# Patient Record
Sex: Male | Born: 1955 | Race: Black or African American | Hispanic: No | Smoking: Light tobacco smoker
Health system: Southern US, Community
[De-identification: ages and names within clinical notes are randomized; demographics above are authoritative.]

## PROBLEM LIST (undated history)

## (undated) ENCOUNTER — Emergency Department (HOSPITAL_COMMUNITY): Admission: EM | Discharge status: Against Medical Advice | Payer: No Typology Code available for payment source

## (undated) DIAGNOSIS — I10 Essential (primary) hypertension: Secondary | ICD-10-CM

## (undated) DIAGNOSIS — E119 Type 2 diabetes mellitus without complications: Secondary | ICD-10-CM

## (undated) DIAGNOSIS — E785 Hyperlipidemia, unspecified: Secondary | ICD-10-CM

## (undated) DIAGNOSIS — S060X9A Concussion with loss of consciousness of unspecified duration, initial encounter: Secondary | ICD-10-CM

## (undated) DIAGNOSIS — S060XAA Concussion with loss of consciousness status unknown, initial encounter: Secondary | ICD-10-CM

## (undated) DIAGNOSIS — K219 Gastro-esophageal reflux disease without esophagitis: Secondary | ICD-10-CM

## (undated) DIAGNOSIS — M1990 Unspecified osteoarthritis, unspecified site: Secondary | ICD-10-CM

## (undated) DIAGNOSIS — S065X9A Traumatic subdural hemorrhage with loss of consciousness of unspecified duration, initial encounter: Secondary | ICD-10-CM

## (undated) DIAGNOSIS — G4730 Sleep apnea, unspecified: Secondary | ICD-10-CM

## (undated) HISTORY — PX: BRAIN SURGERY: SHX531

## (undated) HISTORY — PX: JOINT REPLACEMENT: SHX530

## (undated) HISTORY — DX: Hyperlipidemia, unspecified: E78.5

---

## 2004-08-15 ENCOUNTER — Ambulatory Visit: Payer: Self-pay | Admitting: Orthopedic Surgery

## 2004-08-22 ENCOUNTER — Ambulatory Visit: Payer: Self-pay | Admitting: Orthopedic Surgery

## 2004-10-15 ENCOUNTER — Emergency Department: Payer: Self-pay | Admitting: Emergency Medicine

## 2005-01-31 ENCOUNTER — Ambulatory Visit: Payer: Self-pay

## 2005-07-05 ENCOUNTER — Ambulatory Visit: Payer: Self-pay | Admitting: Family Medicine

## 2005-07-10 ENCOUNTER — Ambulatory Visit: Payer: Self-pay | Admitting: Family Medicine

## 2005-07-19 ENCOUNTER — Other Ambulatory Visit: Payer: Self-pay

## 2005-07-19 ENCOUNTER — Emergency Department: Payer: Self-pay | Admitting: Emergency Medicine

## 2005-07-20 ENCOUNTER — Ambulatory Visit: Payer: Self-pay | Admitting: Emergency Medicine

## 2006-05-01 ENCOUNTER — Ambulatory Visit: Payer: Self-pay | Admitting: General Surgery

## 2006-10-04 ENCOUNTER — Other Ambulatory Visit: Payer: Self-pay

## 2006-10-04 ENCOUNTER — Emergency Department: Payer: Self-pay | Admitting: Emergency Medicine

## 2007-11-10 ENCOUNTER — Emergency Department: Payer: Self-pay | Admitting: Internal Medicine

## 2008-09-23 ENCOUNTER — Emergency Department: Payer: Self-pay | Admitting: Emergency Medicine

## 2009-01-31 ENCOUNTER — Emergency Department: Payer: Self-pay | Admitting: Unknown Physician Specialty

## 2009-02-12 ENCOUNTER — Ambulatory Visit: Payer: Self-pay | Admitting: Internal Medicine

## 2010-01-10 ENCOUNTER — Encounter: Payer: Self-pay | Admitting: Internal Medicine

## 2010-02-03 ENCOUNTER — Encounter: Payer: Self-pay | Admitting: Internal Medicine

## 2010-08-17 ENCOUNTER — Emergency Department: Payer: Self-pay | Admitting: Emergency Medicine

## 2010-08-24 ENCOUNTER — Emergency Department: Payer: Self-pay | Admitting: Emergency Medicine

## 2011-02-15 ENCOUNTER — Emergency Department: Payer: Self-pay | Admitting: Emergency Medicine

## 2011-06-21 ENCOUNTER — Emergency Department: Payer: Self-pay | Admitting: Emergency Medicine

## 2012-06-05 DIAGNOSIS — S065X9A Traumatic subdural hemorrhage with loss of consciousness of unspecified duration, initial encounter: Secondary | ICD-10-CM

## 2012-06-05 DIAGNOSIS — S065XAA Traumatic subdural hemorrhage with loss of consciousness status unknown, initial encounter: Secondary | ICD-10-CM

## 2012-06-05 HISTORY — DX: Traumatic subdural hemorrhage with loss of consciousness status unknown, initial encounter: S06.5XAA

## 2012-06-05 HISTORY — DX: Traumatic subdural hemorrhage with loss of consciousness of unspecified duration, initial encounter: S06.5X9A

## 2012-08-11 ENCOUNTER — Emergency Department: Payer: Self-pay | Admitting: Emergency Medicine

## 2012-09-12 ENCOUNTER — Emergency Department: Payer: Self-pay | Admitting: Emergency Medicine

## 2012-09-12 LAB — CBC
HCT: 39 % — ABNORMAL LOW (ref 40.0–52.0)
HGB: 13.7 g/dL (ref 13.0–18.0)
MCH: 31.8 pg (ref 26.0–34.0)
MCV: 90 fL (ref 80–100)
Platelet: 176 10*3/uL (ref 150–440)
RBC: 4.32 10*6/uL — ABNORMAL LOW (ref 4.40–5.90)
RDW: 13.2 % (ref 11.5–14.5)

## 2012-09-12 LAB — COMPREHENSIVE METABOLIC PANEL
Albumin: 3.8 g/dL (ref 3.4–5.0)
Alkaline Phosphatase: 101 U/L (ref 50–136)
Chloride: 108 mmol/L — ABNORMAL HIGH (ref 98–107)
Glucose: 86 mg/dL (ref 65–99)
Potassium: 4 mmol/L (ref 3.5–5.1)
SGOT(AST): 25 U/L (ref 15–37)
SGPT (ALT): 41 U/L (ref 12–78)
Total Protein: 7.7 g/dL (ref 6.4–8.2)

## 2012-09-12 LAB — URINALYSIS, COMPLETE
Blood: NEGATIVE
Glucose,UR: NEGATIVE mg/dL (ref 0–75)
Hyaline Cast: 1
Ketone: NEGATIVE
Protein: NEGATIVE
Specific Gravity: 1.027 (ref 1.003–1.030)
Squamous Epithelial: 2

## 2012-09-20 DIAGNOSIS — I10 Essential (primary) hypertension: Secondary | ICD-10-CM | POA: Insufficient documentation

## 2012-12-28 ENCOUNTER — Emergency Department (HOSPITAL_COMMUNITY)
Admission: EM | Admit: 2012-12-28 | Discharge: 2012-12-28 | Discharge status: Against Medical Advice | Payer: Medicaid Other | Attending: Emergency Medicine | Admitting: Emergency Medicine

## 2012-12-28 ENCOUNTER — Encounter (HOSPITAL_COMMUNITY): Payer: Self-pay | Admitting: Emergency Medicine

## 2012-12-28 DIAGNOSIS — Z791 Long term (current) use of non-steroidal anti-inflammatories (NSAID): Secondary | ICD-10-CM | POA: Insufficient documentation

## 2012-12-28 DIAGNOSIS — Z79899 Other long term (current) drug therapy: Secondary | ICD-10-CM | POA: Insufficient documentation

## 2012-12-28 DIAGNOSIS — I10 Essential (primary) hypertension: Secondary | ICD-10-CM | POA: Insufficient documentation

## 2012-12-28 DIAGNOSIS — R059 Cough, unspecified: Secondary | ICD-10-CM | POA: Insufficient documentation

## 2012-12-28 DIAGNOSIS — R05 Cough: Secondary | ICD-10-CM

## 2012-12-28 DIAGNOSIS — M129 Arthropathy, unspecified: Secondary | ICD-10-CM | POA: Insufficient documentation

## 2012-12-28 DIAGNOSIS — R6883 Chills (without fever): Secondary | ICD-10-CM | POA: Insufficient documentation

## 2012-12-28 HISTORY — DX: Unspecified osteoarthritis, unspecified site: M19.90

## 2012-12-28 HISTORY — DX: Essential (primary) hypertension: I10

## 2012-12-28 NOTE — ED Notes (Signed)
Patient states that he is leaving because it is taking too long

## 2012-12-28 NOTE — ED Notes (Signed)
Several days of cough and feeling weak; had diarrhea all week long, vomited once last night. Having sweats; unsure if fever. Throat is also sore for entire week, but yesterday throat felt better, but today the throat pain is back and is worse than it was all week. Cough is productive of yellow, phlegm. Lung fields clear throughout all fields. Bowel sounds present in all quadrants. Besides the throat pain, reports no other pain. Had body aches earlier in the week, but does not have any currently.

## 2012-12-28 NOTE — ED Notes (Signed)
PT. REPORTS PRODUCTIVE COUGH WITH BODY ACHES AND CHILLS FOR SEVERAL DAYS UNRELIEVED BY OTC COUGH MEDICATIONS .

## 2012-12-28 NOTE — ED Provider Notes (Signed)
History  This chart was scribed for non-physician practitioner, Rob Cecilio Asper, working with Gavin Pound. Oletta Lamas, MD by Ardeen Jourdain, ED Scribe. This patient was seen in room TR07C/TR07C and the patient's care was started at 2027.  CSN: [id number]     Arrival date & time 12/28/12  1956  First MD Initiated Contact with Patient 12/28/12 2027     Chief Complaint  Patient presents with  . Cough    The history is provided by the patient. No language interpreter was used.    HPI Comments: Gabriel Russell is a 57 y.o. male who presents to the Emergency Department complaining of gradual onset, gradually worsening, constant productive cough with associated body aches, nausea, emesis, diarrhea, sweats and chills. He states the symptoms began 7 days ago. He states his last episode of emesis was last night. He states he had diarrhea for the past week as well. He reports some blood tinged sputum yesterday. He describes the sputum as yellow-green in color. He reports using OTC cold medications with no relief. He denies any constipation as an associated symptom.   Past Medical History  Diagnosis Date  . Hypertension   . Arthritis    Past Surgical History  Procedure Laterality Date  . Brain surgery     No family history on file. History  Substance Use Topics  . Smoking status: Never Smoker   . Smokeless tobacco: Not on file  . Alcohol Use: No    Review of Systems  Constitutional: Positive for chills.  Respiratory: Positive for cough.   All other systems reviewed and are negative.    Allergies  Amoxicillin  Home Medications   Current Outpatient Rx  Name  Route  Sig  Dispense  Refill  . hydrochlorothiazide (HYDRODIURIL) 25 MG tablet   Oral   Take 25 mg by mouth daily.         Marland Kitchen lisinopril (PRINIVIL,ZESTRIL) 5 MG tablet   Oral   Take 5 mg by mouth daily.         . naproxen (NAPROSYN) 500 MG tablet   Oral   Take 500 mg by mouth 2 (two) times daily with a meal.         .  oxyCODONE-acetaminophen (PERCOCET) 10-325 MG per tablet   Oral   Take 1 tablet by mouth 2 (two) times daily as needed for pain (arthritis).         . sildenafil (VIAGRA) 50 MG tablet   Oral   Take 50 mg by mouth daily as needed for erectile dysfunction.          Triage Vitals: BP 125/75  Pulse 88  Temp(Src) 98.3 F (36.8 C) (Oral)  Resp 18  SpO2 98%  Physical Exam  Nursing note and vitals reviewed. Constitutional: He is oriented to person, place, and time. He appears well-developed and well-nourished. No distress.  HENT:  Head: Normocephalic and atraumatic.  Mouth/Throat: Oropharynx is clear and moist. No oropharyngeal exudate.  TMs normal bilaterally   Eyes: EOM are normal.  Neck: Neck supple. No tracheal deviation present.  Cardiovascular: Normal rate, regular rhythm and normal heart sounds.  Exam reveals no gallop and no friction rub.   No murmur heard. Pulmonary/Chest: Effort normal and breath sounds normal. No respiratory distress. He has no wheezes. He has no rales. He exhibits no tenderness.  Abdominal: Soft.  Musculoskeletal: Normal range of motion.  Neurological: He is alert and oriented to person, place, and time.  Skin: Skin is warm and dry.  He is not diaphoretic.  Psychiatric: He has a normal mood and affect. His behavior is normal.    ED Course   Procedures (including critical care time)  DIAGNOSTIC STUDIES: Oxygen Saturation is 98% on room air, normal by my interpretation.    COORDINATION OF CARE:  9:16 PM-Discussed treatment plan which includes CXR with pt at bedside and pt agreed to plan.   9:48 PM- Pt left AMA. He states he did not want to wait for x-ray or other treatment. [name] informed the pt of the risks of leaving, pt understood and left anyways.   Labs Reviewed - No data to display No results found. 1. Cough     MDM  Patient with cough. States that he wants to leave AMA, because the nurses did not check on him for an hour. Patient  understood that he was awaiting his x-ray, but states that, I could have died, no one checked on me." This was after mine and the nurses initial evaluation, wherein the patient was found to be completely stable.  I told him that he'd be going to x-ray shortly. I saw the patient on his way out, and asked him why he is leaving. He stated that it was because the nurses didn't check on him. I discussed the risks of leaving, the patient said, "I'm not mad, I'm just tired of waiting, and I want to go home." Patient is discharged AMA.   I personally performed the services described in this documentation, which was scribed in my presence. The recorded information has been reviewed and is accurate.    Roxy Horseman, PA-C 12/29/12 0008

## 2012-12-30 NOTE — ED Provider Notes (Signed)
Medical screening examination/treatment/procedure(s) were performed by non-physician practitioner and as supervising physician I was immediately available for consultation/collaboration.   Mirca Yale Y. Cashton Hosley, MD 12/30/12 2312 

## 2013-01-12 ENCOUNTER — Observation Stay: Payer: Self-pay | Admitting: Internal Medicine

## 2013-01-12 LAB — CBC
HCT: 41.7 % (ref 40.0–52.0)
MCH: 31.2 pg (ref 26.0–34.0)
MCHC: 35.6 g/dL (ref 32.0–36.0)
RBC: 4.75 10*6/uL (ref 4.40–5.90)
RDW: 13.9 % (ref 11.5–14.5)

## 2013-01-12 LAB — URINALYSIS, COMPLETE
Bilirubin,UR: NEGATIVE
Glucose,UR: NEGATIVE mg/dL (ref 0–75)
Hyaline Cast: 5
Nitrite: NEGATIVE
Ph: 6 (ref 4.5–8.0)
Protein: NEGATIVE
RBC,UR: 4 /HPF (ref 0–5)
Specific Gravity: 1.019 (ref 1.003–1.030)
Squamous Epithelial: 3
WBC UR: 30 /HPF (ref 0–5)

## 2013-01-12 LAB — TROPONIN I: Troponin-I: <0.02 ng/mL

## 2013-01-12 LAB — COMPREHENSIVE METABOLIC PANEL
Calcium, Total: 9.3 mg/dL (ref 8.5–10.1)
Chloride: 105 mmol/L (ref 98–107)
Co2: 27 mmol/L (ref 21–32)
EGFR (African American): >60
EGFR (Non-African Amer.): >60
Glucose: 94 mg/dL (ref 65–99)
Osmolality: 274 (ref 275–301)
SGOT(AST): 66 U/L — ABNORMAL HIGH (ref 15–37)
SGPT (ALT): 37 U/L (ref 12–78)
Sodium: 137 mmol/L (ref 136–145)

## 2013-01-12 LAB — DRUG SCREEN, URINE
Barbiturates, Ur Screen: NEGATIVE (ref ?–200)
Cannabinoid 50 Ng, Ur ~~LOC~~: POSITIVE (ref ?–50)
Cocaine Metabolite,Ur ~~LOC~~: NEGATIVE (ref ?–300)
MDMA (Ecstasy)Ur Screen: NEGATIVE (ref ?–500)
Opiate, Ur Screen: NEGATIVE (ref ?–300)
Phencyclidine (PCP) Ur S: NEGATIVE (ref ?–25)
Tricyclic, Ur Screen: NEGATIVE (ref ?–1000)

## 2013-01-12 LAB — PROTIME-INR: Prothrombin Time: 13.1 secs (ref 11.5–14.7)

## 2013-01-12 LAB — ETHANOL: Ethanol: <3 mg/dL

## 2013-01-12 LAB — CK: CK, Total: 2280 U/L — ABNORMAL HIGH (ref 35–232)

## 2013-01-13 LAB — BASIC METABOLIC PANEL
Anion Gap: 5 — ABNORMAL LOW (ref 7–16)
BUN: 14 mg/dL (ref 7–18)
Chloride: 105 mmol/L (ref 98–107)
Co2: 27 mmol/L (ref 21–32)
Creatinine: 1 mg/dL (ref 0.60–1.30)
Glucose: 110 mg/dL — ABNORMAL HIGH (ref 65–99)
Sodium: 137 mmol/L (ref 136–145)

## 2013-01-13 LAB — CBC WITH DIFFERENTIAL/PLATELET
Eosinophil #: 0 10*3/uL (ref 0.0–0.7)
Eosinophil %: 0.4 %
Lymphocyte %: 11 %
MCHC: 35.8 g/dL (ref 32.0–36.0)
MCV: 88 fL (ref 80–100)
Monocyte #: 0.5 x10 3/mm (ref 0.2–1.0)
Monocyte %: 7.9 %
Neutrophil #: 5.4 10*3/uL (ref 1.4–6.5)
Platelet: 184 10*3/uL (ref 150–440)
WBC: 6.8 10*3/uL (ref 3.8–10.6)

## 2013-01-15 LAB — URINE CULTURE

## 2013-01-17 LAB — CULTURE, BLOOD (SINGLE)

## 2013-04-09 ENCOUNTER — Ambulatory Visit: Payer: Self-pay | Admitting: Specialist

## 2013-04-09 LAB — BASIC METABOLIC PANEL
Anion Gap: 4 — ABNORMAL LOW (ref 7–16)
Calcium, Total: 9 mg/dL (ref 8.5–10.1)
EGFR (African American): >60
EGFR (Non-African Amer.): >60
Osmolality: 277 (ref 275–301)
Sodium: 137 mmol/L (ref 136–145)

## 2013-04-09 LAB — URINALYSIS, COMPLETE
Bilirubin,UR: NEGATIVE
Blood: NEGATIVE
Glucose,UR: NEGATIVE mg/dL (ref 0–75)
Hyaline Cast: 5
Leukocyte Esterase: NEGATIVE
Nitrite: NEGATIVE
Ph: 5 (ref 4.5–8.0)
RBC,UR: 1 /HPF (ref 0–5)
Specific Gravity: 1.026 (ref 1.003–1.030)
WBC UR: 6 /HPF (ref 0–5)

## 2013-04-09 LAB — PROTIME-INR
INR: 1
Prothrombin Time: 13.3 secs (ref 11.5–14.7)

## 2013-04-09 LAB — CBC
HCT: 40.9 % (ref 40.0–52.0)
MCH: 31 pg (ref 26.0–34.0)
MCV: 87 fL (ref 80–100)
Platelet: 181 10*3/uL (ref 150–440)
RBC: 4.68 10*6/uL (ref 4.40–5.90)
RDW: 14.4 % (ref 11.5–14.5)

## 2013-04-09 LAB — APTT: Activated PTT: 32.9 secs (ref 23.6–35.9)

## 2013-04-09 LAB — MRSA PCR SCREENING

## 2013-04-18 ENCOUNTER — Inpatient Hospital Stay: Payer: Self-pay | Admitting: Specialist

## 2013-04-18 LAB — CREATININE, SERUM: Creatinine: 1.26 mg/dL (ref 0.60–1.30)

## 2013-04-19 LAB — HEMOGLOBIN: HGB: 11.6 g/dL — ABNORMAL LOW (ref 13.0–18.0)

## 2013-04-22 LAB — CREATININE, SERUM: EGFR (African American): >60

## 2013-06-21 ENCOUNTER — Emergency Department: Payer: Self-pay | Admitting: Emergency Medicine

## 2013-07-25 ENCOUNTER — Ambulatory Visit: Payer: Self-pay | Admitting: Internal Medicine

## 2013-08-02 ENCOUNTER — Ambulatory Visit: Payer: Self-pay | Admitting: Neurosurgery

## 2013-08-06 ENCOUNTER — Other Ambulatory Visit: Payer: Self-pay | Admitting: Neurosurgery

## 2013-08-26 ENCOUNTER — Encounter (HOSPITAL_COMMUNITY)
Admission: RE | Admit: 2013-08-26 | Discharge: 2013-08-26 | Disposition: A | Discharge status: Home / Self Care | Payer: Medicaid Other | Source: Ambulatory Visit | Attending: Neurosurgery | Admitting: Neurosurgery

## 2013-08-26 ENCOUNTER — Encounter (HOSPITAL_COMMUNITY): Payer: Self-pay

## 2013-08-26 DIAGNOSIS — Z01818 Encounter for other preprocedural examination: Secondary | ICD-10-CM | POA: Insufficient documentation

## 2013-08-26 DIAGNOSIS — Z01810 Encounter for preprocedural cardiovascular examination: Secondary | ICD-10-CM | POA: Insufficient documentation

## 2013-08-26 DIAGNOSIS — Z01812 Encounter for preprocedural laboratory examination: Secondary | ICD-10-CM | POA: Insufficient documentation

## 2013-08-26 HISTORY — DX: Concussion with loss of consciousness status unknown, initial encounter: S06.0XAA

## 2013-08-26 HISTORY — DX: Concussion with loss of consciousness of unspecified duration, initial encounter: S06.0X9A

## 2013-08-26 HISTORY — DX: Sleep apnea, unspecified: G47.30

## 2013-08-26 LAB — BASIC METABOLIC PANEL
BUN: 13 mg/dL (ref 6–23)
CO2: 25 mEq/L (ref 19–32)
Calcium: 9.7 mg/dL (ref 8.4–10.5)
Chloride: 98 mEq/L (ref 96–112)
Creatinine, Ser: 1.09 mg/dL (ref 0.50–1.35)
GFR calc non Af Amer: 73 mL/min — ABNORMAL LOW (ref >90)
GFR, EST AFRICAN AMERICAN: 85 mL/min — AB (ref >90)
Glucose, Bld: 94 mg/dL (ref 70–99)
POTASSIUM: 4 meq/L (ref 3.7–5.3)
SODIUM: 138 meq/L (ref 137–147)

## 2013-08-26 LAB — CBC
HCT: 44.5 % (ref 39.0–52.0)
HEMOGLOBIN: 16.1 g/dL (ref 13.0–17.0)
MCH: 31.5 pg (ref 26.0–34.0)
MCHC: 36.2 g/dL — AB (ref 30.0–36.0)
MCV: 87.1 fL (ref 78.0–100.0)
Platelets: 179 10*3/uL (ref 150–400)
RBC: 5.11 MIL/uL (ref 4.22–5.81)
RDW: 14.2 % (ref 11.5–15.5)
WBC: 5.1 10*3/uL (ref 4.0–10.5)

## 2013-08-26 LAB — SURGICAL PCR SCREEN
MRSA, PCR: NEGATIVE
STAPHYLOCOCCUS AUREUS: NEGATIVE

## 2013-08-26 NOTE — Pre-Procedure Instructions (Signed)
Gabriel Russell  08/26/2013   Your procedure is scheduled on:  09/04/13  Report to [facility]  2 * 3 at 930 AM.  Call this number if you have problems the morning of surgery: [phone]   Remember:   Do not eat food or drink liquids after midnight.   Take these medicines the morning of surgery with A SIP OF WATER: percocet   Do not wear jewelry, make-up or nail polish.  Do not wear lotions, powders, or perfumes. You may wear deodorant.  Do not shave 48 hours prior to surgery. Men may shave face and neck.  Do not bring valuables to the hospital.  [facility] is not responsible                  for any belongings or valuables.               Contacts, dentures or bridgework may not be worn into surgery.  Leave suitcase in the car. After surgery it may be brought to your room.  For patients admitted to the hospital, discharge time is determined by your                treatment team.               Patients discharged the day of surgery will not be allowed to drive  home.  Name and phone number of your driver: family  Special Instructions: Shower using CHG 2 nights before surgery and the night before surgery.  If you shower the day of surgery use CHG.  Use special wash - you have one bottle of CHG for all showers.  You should use approximately 1/3 of the bottle for each shower.   Please read over the following fact sheets that you were given: Pain Booklet, Coughing and Deep Breathing, MRSA Information and Surgical Site Infection Prevention

## 2013-09-03 MED ORDER — VANCOMYCIN HCL 10 G IV SOLR
1500.0000 mg | INTRAVENOUS | Status: AC
  Administered 2013-09-04: 1500 mg via INTRAVENOUS
  Filled 2013-09-03: qty 1500

## 2013-09-03 MED ORDER — DEXAMETHASONE SODIUM PHOSPHATE 10 MG/ML IJ SOLN
10.0000 mg | INTRAMUSCULAR | Status: AC
  Administered 2013-09-04: 10 mg via INTRAVENOUS
  Filled 2013-09-03: qty 1

## 2013-09-04 ENCOUNTER — Inpatient Hospital Stay (HOSPITAL_COMMUNITY): Payer: Medicaid Other

## 2013-09-04 ENCOUNTER — Encounter (HOSPITAL_COMMUNITY): Payer: Self-pay | Admitting: Certified Registered Nurse Anesthetist

## 2013-09-04 ENCOUNTER — Encounter (HOSPITAL_COMMUNITY)
Admission: RE | Disposition: A | Discharge status: Home / Self Care | Payer: Self-pay | Source: Ambulatory Visit | Attending: Neurosurgery

## 2013-09-04 ENCOUNTER — Inpatient Hospital Stay (HOSPITAL_COMMUNITY)
Admission: RE | Admit: 2013-09-04 | Discharge: 2013-09-05 | DRG: 473 | Disposition: A | Discharge status: Home / Self Care | Payer: Medicaid Other | Source: Ambulatory Visit | Attending: Neurosurgery | Admitting: Neurosurgery

## 2013-09-04 ENCOUNTER — Encounter (HOSPITAL_COMMUNITY): Payer: Medicaid Other | Admitting: Certified Registered Nurse Anesthetist

## 2013-09-04 ENCOUNTER — Inpatient Hospital Stay (HOSPITAL_COMMUNITY): Payer: Medicaid Other | Admitting: Certified Registered Nurse Anesthetist

## 2013-09-04 DIAGNOSIS — M4802 Spinal stenosis, cervical region: Secondary | ICD-10-CM | POA: Diagnosis present

## 2013-09-04 DIAGNOSIS — G4730 Sleep apnea, unspecified: Secondary | ICD-10-CM | POA: Diagnosis present

## 2013-09-04 DIAGNOSIS — M129 Arthropathy, unspecified: Secondary | ICD-10-CM | POA: Diagnosis present

## 2013-09-04 DIAGNOSIS — I10 Essential (primary) hypertension: Secondary | ICD-10-CM | POA: Diagnosis present

## 2013-09-04 DIAGNOSIS — Z881 Allergy status to other antibiotic agents status: Secondary | ICD-10-CM

## 2013-09-04 HISTORY — PX: ANTERIOR CERVICAL DECOMPRESSION/DISCECTOMY FUSION 4 LEVELS: SHX5556

## 2013-09-04 SURGERY — ANTERIOR CERVICAL DECOMPRESSION/DISCECTOMY FUSION 4 LEVELS
Anesthesia: General

## 2013-09-04 MED ORDER — FENTANYL CITRATE 0.05 MG/ML IJ SOLN
INTRAMUSCULAR | Status: AC
  Filled 2013-09-04: qty 5

## 2013-09-04 MED ORDER — ROCURONIUM BROMIDE 100 MG/10ML IV SOLN
INTRAVENOUS | Status: DC | PRN
  Administered 2013-09-04 (×2): 10 mg via INTRAVENOUS
  Administered 2013-09-04: 20 mg via INTRAVENOUS
  Administered 2013-09-04: 10 mg via INTRAVENOUS
  Administered 2013-09-04: 50 mg via INTRAVENOUS

## 2013-09-04 MED ORDER — MENTHOL 3 MG MT LOZG: 1.0000 | LOZENGE | OROMUCOSAL | Status: DC | PRN

## 2013-09-04 MED ORDER — ONDANSETRON HCL 4 MG/2ML IJ SOLN
INTRAMUSCULAR | Status: DC | PRN
  Administered 2013-09-04: 4 mg via INTRAVENOUS

## 2013-09-04 MED ORDER — PROPOFOL 10 MG/ML IV BOLUS
INTRAVENOUS | Status: AC
  Filled 2013-09-04: qty 20

## 2013-09-04 MED ORDER — CYCLOBENZAPRINE HCL 10 MG PO TABS
ORAL_TABLET | ORAL | Status: AC
  Filled 2013-09-04: qty 1

## 2013-09-04 MED ORDER — MIDAZOLAM HCL 2 MG/2ML IJ SOLN
INTRAMUSCULAR | Status: AC
  Filled 2013-09-04: qty 2

## 2013-09-04 MED ORDER — THROMBIN 20000 UNITS EX SOLR
CUTANEOUS | Status: DC | PRN
  Administered 2013-09-04: 11:00:00 via TOPICAL

## 2013-09-04 MED ORDER — PHENOL 1.4 % MT LIQD: 1.0000 | OROMUCOSAL | Status: DC | PRN

## 2013-09-04 MED ORDER — NEOSTIGMINE METHYLSULFATE 1 MG/ML IJ SOLN
INTRAMUSCULAR | Status: AC
  Filled 2013-09-04: qty 10

## 2013-09-04 MED ORDER — AMLODIPINE BESYLATE-VALSARTAN 10-320 MG PO TABS: 1.0000 | ORAL_TABLET | Freq: Every day | ORAL | Status: DC

## 2013-09-04 MED ORDER — GLYCOPYRROLATE 0.2 MG/ML IJ SOLN
INTRAMUSCULAR | Status: AC
  Filled 2013-09-04: qty 3

## 2013-09-04 MED ORDER — PROPOFOL 10 MG/ML IV BOLUS
INTRAVENOUS | Status: DC | PRN
  Administered 2013-09-04: 200 mg via INTRAVENOUS

## 2013-09-04 MED ORDER — ACETAMINOPHEN 325 MG PO TABS: 650.0000 mg | ORAL_TABLET | ORAL | Status: DC | PRN

## 2013-09-04 MED ORDER — PANTOPRAZOLE SODIUM 40 MG IV SOLR
40.0000 mg | Freq: Every day | INTRAVENOUS | Status: DC
  Filled 2013-09-04: qty 40

## 2013-09-04 MED ORDER — ACETAMINOPHEN 650 MG RE SUPP: 650.0000 mg | RECTAL | Status: DC | PRN

## 2013-09-04 MED ORDER — PANTOPRAZOLE SODIUM 40 MG PO TBEC
40.0000 mg | DELAYED_RELEASE_TABLET | Freq: Every day | ORAL | Status: DC
  Administered 2013-09-04: 40 mg via ORAL
  Filled 2013-09-04: qty 1

## 2013-09-04 MED ORDER — VANCOMYCIN HCL IN DEXTROSE 1-5 GM/200ML-% IV SOLN
1000.0000 mg | Freq: Two times a day (BID) | INTRAVENOUS | Status: DC
  Filled 2013-09-04 (×2): qty 200

## 2013-09-04 MED ORDER — LISINOPRIL 20 MG PO TABS
20.0000 mg | ORAL_TABLET | Freq: Every day | ORAL | Status: DC
  Administered 2013-09-04: 20 mg via ORAL
  Filled 2013-09-04 (×2): qty 1

## 2013-09-04 MED ORDER — OXYCODONE HCL 5 MG PO TABS
5.0000 mg | ORAL_TABLET | Freq: Once | ORAL | Status: AC | PRN
  Administered 2013-09-04: 5 mg via ORAL

## 2013-09-04 MED ORDER — LACTATED RINGERS IV SOLN
INTRAVENOUS | Status: DC
  Administered 2013-09-04 (×3): via INTRAVENOUS

## 2013-09-04 MED ORDER — HYDROMORPHONE HCL PF 1 MG/ML IJ SOLN
INTRAMUSCULAR | Status: AC
  Filled 2013-09-04: qty 1

## 2013-09-04 MED ORDER — LIDOCAINE HCL (CARDIAC) 20 MG/ML IV SOLN
INTRAVENOUS | Status: DC | PRN
  Administered 2013-09-04: 100 mg via INTRAVENOUS

## 2013-09-04 MED ORDER — OXYCODONE HCL 5 MG/5ML PO SOLN: 5.0000 mg | Freq: Once | ORAL | Status: AC | PRN

## 2013-09-04 MED ORDER — ONDANSETRON HCL 4 MG/2ML IJ SOLN: 4.0000 mg | INTRAMUSCULAR | Status: DC | PRN

## 2013-09-04 MED ORDER — MIDAZOLAM HCL 5 MG/5ML IJ SOLN
INTRAMUSCULAR | Status: DC | PRN
  Administered 2013-09-04: 4 mg via INTRAVENOUS

## 2013-09-04 MED ORDER — DEXAMETHASONE SODIUM PHOSPHATE 4 MG/ML IJ SOLN: 4.0000 mg | Freq: Four times a day (QID) | INTRAMUSCULAR | Status: AC

## 2013-09-04 MED ORDER — OXYCODONE HCL 5 MG PO TABS
ORAL_TABLET | ORAL | Status: AC
  Filled 2013-09-04: qty 1

## 2013-09-04 MED ORDER — GLYCOPYRROLATE 0.2 MG/ML IJ SOLN
INTRAMUSCULAR | Status: DC | PRN
  Administered 2013-09-04: 0.6 mg via INTRAVENOUS

## 2013-09-04 MED ORDER — LISINOPRIL-HYDROCHLOROTHIAZIDE 20-25 MG PO TABS: 1.0000 | ORAL_TABLET | Freq: Every day | ORAL | Status: DC

## 2013-09-04 MED ORDER — SODIUM CHLORIDE 0.9 % IJ SOLN: 3.0000 mL | Freq: Two times a day (BID) | INTRAMUSCULAR | Status: DC

## 2013-09-04 MED ORDER — AMLODIPINE BESYLATE 10 MG PO TABS
10.0000 mg | ORAL_TABLET | Freq: Every day | ORAL | Status: DC
  Administered 2013-09-04: 10 mg via ORAL
  Filled 2013-09-04 (×2): qty 1

## 2013-09-04 MED ORDER — IRBESARTAN 300 MG PO TABS
300.0000 mg | ORAL_TABLET | Freq: Every day | ORAL | Status: DC
  Administered 2013-09-04: 300 mg via ORAL
  Filled 2013-09-04 (×2): qty 1

## 2013-09-04 MED ORDER — ROCURONIUM BROMIDE 50 MG/5ML IV SOLN
INTRAVENOUS | Status: AC
  Filled 2013-09-04: qty 1

## 2013-09-04 MED ORDER — EPHEDRINE SULFATE 50 MG/ML IJ SOLN
INTRAMUSCULAR | Status: DC | PRN
  Administered 2013-09-04: 10 mg via INTRAVENOUS

## 2013-09-04 MED ORDER — SODIUM CHLORIDE 0.9 % IJ SOLN
INTRAMUSCULAR | Status: AC
  Filled 2013-09-04: qty 10

## 2013-09-04 MED ORDER — HYDROCHLOROTHIAZIDE 25 MG PO TABS
25.0000 mg | ORAL_TABLET | Freq: Every day | ORAL | Status: DC
  Administered 2013-09-04: 25 mg via ORAL
  Filled 2013-09-04 (×2): qty 1

## 2013-09-04 MED ORDER — FENTANYL CITRATE 0.05 MG/ML IJ SOLN
INTRAMUSCULAR | Status: DC | PRN
  Administered 2013-09-04: 150 ug via INTRAVENOUS
  Administered 2013-09-04: 50 ug via INTRAVENOUS
  Administered 2013-09-04: 100 ug via INTRAVENOUS
  Administered 2013-09-04: 150 ug via INTRAVENOUS
  Administered 2013-09-04 (×2): 50 ug via INTRAVENOUS
  Administered 2013-09-04: 100 ug via INTRAVENOUS
  Administered 2013-09-04 (×2): 50 ug via INTRAVENOUS

## 2013-09-04 MED ORDER — CYCLOBENZAPRINE HCL 10 MG PO TABS
10.0000 mg | ORAL_TABLET | Freq: Three times a day (TID) | ORAL | Status: DC | PRN
  Administered 2013-09-04 (×2): 10 mg via ORAL
  Filled 2013-09-04: qty 1

## 2013-09-04 MED ORDER — SODIUM CHLORIDE 0.9 % IR SOLN
Status: DC | PRN
  Administered 2013-09-04: 11:00:00

## 2013-09-04 MED ORDER — 0.9 % SODIUM CHLORIDE (POUR BTL) OPTIME
TOPICAL | Status: DC | PRN
  Administered 2013-09-04: 1000 mL

## 2013-09-04 MED ORDER — HYDROMORPHONE HCL PF 1 MG/ML IJ SOLN
1.0000 mg | INTRAMUSCULAR | Status: DC | PRN
  Administered 2013-09-04: 1 mg via INTRAMUSCULAR
  Filled 2013-09-04: qty 1

## 2013-09-04 MED ORDER — DOCUSATE SODIUM 100 MG PO CAPS
100.0000 mg | ORAL_CAPSULE | Freq: Two times a day (BID) | ORAL | Status: DC
  Administered 2013-09-04: 100 mg via ORAL
  Filled 2013-09-04 (×3): qty 1

## 2013-09-04 MED ORDER — KCL IN DEXTROSE-NACL 20-5-0.45 MEQ/L-%-% IV SOLN
80.0000 mL/h | INTRAVENOUS | Status: DC
  Filled 2013-09-04 (×3): qty 1000

## 2013-09-04 MED ORDER — NEOSTIGMINE METHYLSULFATE 1 MG/ML IJ SOLN
INTRAMUSCULAR | Status: DC | PRN
Start: 2013-09-04 — End: 2013-09-04
  Administered 2013-09-04: 5 mg via INTRAVENOUS

## 2013-09-04 MED ORDER — SODIUM CHLORIDE 0.9 % IJ SOLN: 3.0000 mL | INTRAMUSCULAR | Status: DC | PRN

## 2013-09-04 MED ORDER — FENTANYL 25 MCG/HR TD PT72
25.0000 ug | MEDICATED_PATCH | TRANSDERMAL | Status: DC
  Administered 2013-09-04: 25 ug via TRANSDERMAL
  Filled 2013-09-04: qty 1

## 2013-09-04 MED ORDER — ONDANSETRON HCL 4 MG/2ML IJ SOLN
INTRAMUSCULAR | Status: AC
  Filled 2013-09-04: qty 2

## 2013-09-04 MED ORDER — EPHEDRINE SULFATE 50 MG/ML IJ SOLN
INTRAMUSCULAR | Status: AC
  Filled 2013-09-04: qty 1

## 2013-09-04 MED ORDER — HYDROCODONE-ACETAMINOPHEN 5-325 MG PO TABS
1.0000 | ORAL_TABLET | ORAL | Status: DC | PRN
  Administered 2013-09-04 – 2013-09-05 (×4): 2 via ORAL
  Filled 2013-09-04 (×4): qty 2

## 2013-09-04 MED ORDER — DEXAMETHASONE 4 MG PO TABS
4.0000 mg | ORAL_TABLET | Freq: Four times a day (QID) | ORAL | Status: AC
  Administered 2013-09-04 (×2): 4 mg via ORAL
  Filled 2013-09-04 (×2): qty 1

## 2013-09-04 MED ORDER — LIDOCAINE HCL 4 % MT SOLN
OROMUCOSAL | Status: DC | PRN
  Administered 2013-09-04: 4 mL via TOPICAL

## 2013-09-04 MED ORDER — SODIUM CHLORIDE 0.9 % IV SOLN: 250.0000 mL | INTRAVENOUS | Status: DC

## 2013-09-04 MED ORDER — HYDROMORPHONE HCL PF 1 MG/ML IJ SOLN
0.2500 mg | INTRAMUSCULAR | Status: DC | PRN
  Administered 2013-09-04: 0.5 mg via INTRAVENOUS

## 2013-09-04 MED ORDER — THROMBIN 5000 UNITS EX SOLR
CUTANEOUS | Status: DC | PRN
Start: 2013-09-04 — End: 2013-09-04
  Administered 2013-09-04: 16:00:00 via TOPICAL

## 2013-09-04 MED ORDER — PROMETHAZINE HCL 25 MG/ML IJ SOLN: 6.2500 mg | INTRAMUSCULAR | Status: DC | PRN

## 2013-09-04 MED ORDER — LIDOCAINE HCL (CARDIAC) 20 MG/ML IV SOLN
INTRAVENOUS | Status: AC
  Filled 2013-09-04: qty 10

## 2013-09-04 SURGICAL SUPPLY — 66 items
BAG DECANTER FOR FLEXI CONT (MISCELLANEOUS) ×3 IMPLANT
BENZOIN TINCTURE PRP APPL 2/3 (GAUZE/BANDAGES/DRESSINGS) ×3 IMPLANT
BIT DRILL TRINICA 2.3MM (BIT) ×1 IMPLANT
BONE EQUIVA 5CC (Bone Implant) ×3 IMPLANT
BRUSH SCRUB EZ PLAIN DRY (MISCELLANEOUS) ×3 IMPLANT
BUR MATCHSTICK NEURO 3.0 LAGG (BURR) ×3 IMPLANT
CANISTER SUCT 3000ML (MISCELLANEOUS) ×3 IMPLANT
CLOSURE WOUND 1/2 X4 (GAUZE/BANDAGES/DRESSINGS) ×2
CONT SPEC 4OZ CLIKSEAL STRL BL (MISCELLANEOUS) ×3 IMPLANT
DRAIN SNY WOU 7FLT (WOUND CARE) ×3 IMPLANT
DRAPE C-ARM 42X72 X-RAY (DRAPES) ×6 IMPLANT
DRAPE LAPAROTOMY 100X72 PEDS (DRAPES) ×3 IMPLANT
DRAPE MICROSCOPE ZEISS OPMI (DRAPES) ×3 IMPLANT
DRAPE POUCH INSTRU U-SHP 10X18 (DRAPES) ×3 IMPLANT
DRAPE SURG 17X23 STRL (DRAPES) ×6 IMPLANT
DRESSING TELFA 8X3 (GAUZE/BANDAGES/DRESSINGS) ×3 IMPLANT
DRILL BIT TRINICA 2.3MM (BIT) ×3
DURAPREP 6ML APPLICATOR 50/CS (WOUND CARE) ×3 IMPLANT
ELECT COATED BLADE 2.86 ST (ELECTRODE) ×3 IMPLANT
ELECT REM PT RETURN 9FT ADLT (ELECTROSURGICAL) ×3
ELECTRODE REM PT RTRN 9FT ADLT (ELECTROSURGICAL) ×1 IMPLANT
EVACUATOR SILICONE 100CC (DRAIN) ×3 IMPLANT
GAUZE SPONGE 4X4 16PLY XRAY LF (GAUZE/BANDAGES/DRESSINGS) IMPLANT
GLOVE ECLIPSE 6.5 STRL STRAW (GLOVE) ×3 IMPLANT
GLOVE ECLIPSE 7.5 STRL STRAW (GLOVE) ×15 IMPLANT
GLOVE ECLIPSE 8.0 STRL XLNG CF (GLOVE) ×3 IMPLANT
GLOVE EXAM NITRILE LRG STRL (GLOVE) ×6 IMPLANT
GLOVE EXAM NITRILE XL STR (GLOVE) IMPLANT
GLOVE EXAM NITRILE XS STR PU (GLOVE) IMPLANT
GLOVE INDICATOR 7.5 STRL GRN (GLOVE) ×3 IMPLANT
GLOVE INDICATOR 8.0 STRL GRN (GLOVE) ×6 IMPLANT
GOWN BRE IMP SLV AUR LG STRL (GOWN DISPOSABLE) IMPLANT
GOWN BRE IMP SLV AUR XL STRL (GOWN DISPOSABLE) IMPLANT
GOWN SPEC L3 XXLG W/TWL (GOWN DISPOSABLE) ×6 IMPLANT
GOWN STRL REIN 2XL LVL4 (GOWN DISPOSABLE) ×3 IMPLANT
GOWN STRL REUS W/ TWL LRG LVL3 (GOWN DISPOSABLE) ×1 IMPLANT
GOWN STRL REUS W/ TWL XL LVL3 (GOWN DISPOSABLE) ×1 IMPLANT
GOWN STRL REUS W/TWL LRG LVL3 (GOWN DISPOSABLE) ×2
GOWN STRL REUS W/TWL XL LVL3 (GOWN DISPOSABLE) ×2
HALTER HD/CHIN CERV TRACTION D (MISCELLANEOUS) ×3 IMPLANT
HEMOSTAT POWDER SURGIFOAM 1G (HEMOSTASIS) ×3 IMPLANT
KIT BASIN OR (CUSTOM PROCEDURE TRAY) ×3 IMPLANT
KIT ROOM TURNOVER OR (KITS) ×3 IMPLANT
NEEDLE SPNL 20GX3.5 QUINCKE YW (NEEDLE) ×3 IMPLANT
NS IRRIG 1000ML POUR BTL (IV SOLUTION) ×3 IMPLANT
PACK LAMINECTOMY NEURO (CUSTOM PROCEDURE TRAY) ×3 IMPLANT
PAD ARMBOARD 7.5X6 YLW CONV (MISCELLANEOUS) ×3 IMPLANT
PATTIES SURGICAL .25X.25 (GAUZE/BANDAGES/DRESSINGS) IMPLANT
PATTIES SURGICAL .75X.75 (GAUZE/BANDAGES/DRESSINGS) ×3 IMPLANT
PLATE TRINICA 83MM (Plate) ×3 IMPLANT
RUBBERBAND STERILE (MISCELLANEOUS) ×6 IMPLANT
SCREW SELF DRILL FIXED 14MM (Screw) ×24 IMPLANT
SCREW SELF DRILL VAR 14MMX4.2 (Screw) ×6 IMPLANT
SPACER TM-S 11X14X5-7 (Spacer) ×9 IMPLANT
SPACER TMS 11X14X6MM (Spacer) ×3 IMPLANT
SPONGE GAUZE 4X4 12PLY (GAUZE/BANDAGES/DRESSINGS) ×3 IMPLANT
SPONGE INTESTINAL PEANUT (DISPOSABLE) ×3 IMPLANT
SPONGE SURGIFOAM ABS GEL 100 (HEMOSTASIS) ×3 IMPLANT
STRIP CLOSURE SKIN 1/2X4 (GAUZE/BANDAGES/DRESSINGS) ×4 IMPLANT
SUT PDS AB 5-0 P3 18 (SUTURE) ×3 IMPLANT
SUT VIC AB 3-0 CP2 18 (SUTURE) ×6 IMPLANT
SYR 20ML ECCENTRIC (SYRINGE) IMPLANT
TOWEL OR 17X24 6PK STRL BLUE (TOWEL DISPOSABLE) ×3 IMPLANT
TOWEL OR 17X26 10 PK STRL BLUE (TOWEL DISPOSABLE) ×3 IMPLANT
TRAP SPECIMEN MUCOUS 40CC (MISCELLANEOUS) ×3 IMPLANT
WATER STERILE IRR 1000ML POUR (IV SOLUTION) ×3 IMPLANT

## 2013-09-04 NOTE — Anesthesia Preprocedure Evaluation (Addendum)
Anesthesia Evaluation  Patient identified by MRN, date of birth, ID band Patient awake    Reviewed: Allergy & Precautions, H&P , NPO status , Patient's Chart, lab work & pertinent test results  Airway Mallampati: I TM Distance: >3 FB Neck ROM: Limited    Dental  (+) Missing, Teeth Intact, Dental Advisory Given   Pulmonary sleep apnea ,  breath sounds clear to auscultation        Cardiovascular hypertension, Rhythm:Regular Rate:Normal     Neuro/Psych    GI/Hepatic   Endo/Other    Renal/GU      Musculoskeletal   Abdominal (+) + obese,   Peds  Hematology   Anesthesia Other Findings   Reproductive/Obstetrics                          Anesthesia Physical Anesthesia Plan  ASA: II  Anesthesia Plan: General   Post-op Pain Management:    Induction: Intravenous  Airway Management Planned: Oral ETT  Additional Equipment:   Intra-op Plan:   Post-operative Plan: Extubation in OR  Informed Consent: I have reviewed the patients History and Physical, chart, labs and discussed the procedure including the risks, benefits and alternatives for the proposed anesthesia with the patient or authorized representative who has indicated his/her understanding and acceptance.     Plan Discussed with: CRNA and Surgeon  Anesthesia Plan Comments:         Anesthesia Quick Evaluation

## 2013-09-04 NOTE — Transfer of Care (Signed)
Immediate Anesthesia Transfer of Care Note  Patient: Gabriel Russell  Procedure(s) Performed: Procedure(s): ANTERIOR CERVICAL DECOMPRESSION/DISCECTOMY FUSION CERVICAL THREE-FOUR, CERVICAL FOUR-FIVE, CERVICAL FIVE-SIX, CERVICALSIX-SEVEN  (N/A)  Patient Location: PACU  Anesthesia Type:General  Level of Consciousness: awake and alert   Airway & Oxygen Therapy: Patient Spontanous Breathing and Patient connected to face mask oxygen  Post-op Assessment: Report given to PACU RN and Post -op Vital signs reviewed and stable  Post vital signs: Reviewed and stable  Complications: No apparent anesthesia complications

## 2013-09-04 NOTE — Progress Notes (Signed)
ANTIBIOTIC CONSULT NOTE - INITIAL  Pharmacy Consult for Vancomycin Indication: Surgical Prophylaxsis  Allergies  Allergen Reactions  . Amoxicillin     sweats    Patient Measurements: Weight: 250 lb (113.399 kg)  Vital Signs: Temp: 97.5 F (36.4 C) (04/02 1735) Temp src: Oral (04/02 0921) BP: 145/85 mmHg (04/02 1735) Pulse Rate: 83 (04/02 1735) Intake/Output from previous day:   Intake/Output from this shift: Total I/O In: 2000 [I.V.:2000] Out: 750 [Urine:600; Drains:50; Blood:100]  Labs: No results found for this basename: WBC, HGB, PLT, LABCREA, CREATININE,  in the last 72 hours CrCl is unknown because there is no height on file for the current visit. No results found for this basename: VANCOTROUGH, Leodis Binet, VANCORANDOM, GENTTROUGH, GENTPEAK, GENTRANDOM, TOBRATROUGH, TOBRAPEAK, TOBRARND, AMIKACINPEAK, AMIKACINTROU, AMIKACIN,  in the last 72 hours   Microbiology: Recent Results (from the past 720 hour(s))  SURGICAL PCR SCREEN     Status: None   Collection Time    08/26/13 10:19 AM      Result Value Ref Range Status   MRSA, PCR NEGATIVE  NEGATIVE Final   Staphylococcus aureus NEGATIVE  NEGATIVE Final   Comment:            The Xpert SA Assay (FDA     approved for NASAL specimens     in patients over 85 years of age),     is one component of     a comprehensive surveillance     program.  Test performance has     been validated by [employer] for patients greater     than or equal to 77 year old.     It is not intended     to diagnose infection nor to     guide or monitor treatment.    Medical History: Past Medical History  Diagnosis Date  . Hypertension   . Arthritis   . Concussion   . Sleep apnea     cpap     >3 yrs    Medications:  Prescriptions prior to admission  Medication Sig Dispense Refill  . amLODipine-valsartan (EXFORGE) 10-320 MG per tablet Take 1 tablet by mouth daily.      . fentaNYL (DURAGESIC - DOSED MCG/HR) 25 MCG/HR patch Place  25 mcg onto the skin every 3 (three) days.      Marland Kitchen lisinopril-hydrochlorothiazide (PRINZIDE,ZESTORETIC) 20-25 MG per tablet Take 1 tablet by mouth daily.      Marland Kitchen oxyCODONE-acetaminophen (PERCOCET) 10-325 MG per tablet Take 1 tablet by mouth 2 (two) times daily as needed for pain (arthritis).      . sildenafil (VIAGRA) 50 MG tablet Take 50 mg by mouth daily as needed for erectile dysfunction.       Assessment: 57 yo M s/p cervical discectomy and fusion with drain left in place after procedure.  Pharmacy consulted to dose vancomycin for post op surgical prophylaxis.  ID: Post op px: Vanc 4/2 >>  Goal of Therapy:  Vancomycin trough level 10-15 mcg/ml  Plan:  1. Vancomycin 1 g IV q12h 2. Follow up SCr, UOP, cultures, clinical course and adjust as clinically indicated.   Breeona Waid Christine Virginia Crews 09/04/2013,6:36 PM

## 2013-09-04 NOTE — Op Note (Signed)
Preop diagnosis: Spinal stenosis C3-4 C4-5 C5-6 C6-7 with spinal cord contusion C3-4 and C4-5 Postop diagnosis: Same Procedure: Decompressive anterior cervical discectomy C3-4 C4-5 C5-6 C6-7 with trabecular metal interbody fusion and Trinica anterior cervical plating Surgeon: Grethel Zenk Assistant: Cabbell  After and placed the supine position and 10 pounds halter traction the patient's neck was prepped and draped in usual sterile fashion. Linear incision was made along the anterior aspect of the sternocleidomastoid muscle. The platysma muscle was then incised linearly. The plane between the strap muscles medially and the sternal cremaster laterally was identified and dissected free to allow access to the anterior aspect of the cervical spine. The longus coli muscle was identified and split in the midline and Triple-A bilaterally with unipolar coagulation and Kitner dissection. Self-retaining tract was placed for exposure and x-ray showed approach the appropriate levels. Using a 15 blade the and the cement disc at C3-4 C4-5 C5-6 and C6-7 was incised. Using pituitaries years and curettes approximately 90% of the disc material was removed. High-speed drill was used to widen the interspace is at all 4 levels and bony shavings were saved for use later in the case. At this time the microscope was draped brought into the field and used for the remainder of the case. Starting C6-7 the remainder of the disc material down the posterior longitudinal ligament was removed. Ligament was incised transversely. Cut edges removed a large amounts of herniated disc material and bony overgrowth removed particularly on the left side of the midline. Thorough decompression was carried out on the spinal dura and to the proximal foramen bilaterally. Similar decompression was then carried out at C5-6. This time, the large compressive spur and disc within the midline and these were removed to give excellent decompression visualization of  the underlying spinal dura. Proximal foraminal identified bilaterally and decompressed until the proximal C6 nerve roots well visualized and decompressed. Attention was then turned to C4-5 where diffuse disc herniation was noted the presence. This was thoroughly cleaned off the spinal dura until was well decompressed well visualized and the foramen bilaterally. Finally, at C3 for similar decompression was carried out with there is a diffuse disc herniation with diffuse stenosis at C3-4. Thorough decompression was carried out of the bony overgrowth and herniated disc material at this level as well. At this time inspection was carried out on direct as a levels for any evidence of residual compression. Appropriately length trabecular metal graft was then chosen and filled with a mixture of autologous bone morselized allograft. We irrigated copiously and controlled any bleeding with upper coagulation Gelfoam and Surgifoam. The plugs were impacted at difficulty and fluoroscopy showed them to be in good position. An appropriately length Trinica anterior cervical plate was then chosen. Under fluoroscopic guidance drill holes were placed followed by placing of atrophic screws into variable screws. Fossae showed to be in good position the locking mechanism was rotated locked position. Final fossae showed good position of the plates screws and plugs. Irrigation was carried out and any bleeding control proper coagulation and Surgifoam. We left a prevertebral drain in the prevertebral space and brought out through separate stab wound incision. The was then closed with inverted Vicryl on the platysma muscle and subcuticular layer. Steri-Strips were placed on the skin. A sterile dressing a soft collar applied. The patient was extubated and taken to recovery room in stable condition.

## 2013-09-04 NOTE — Anesthesia Procedure Notes (Signed)
Procedure Name: Intubation Date/Time: 09/04/2013 11:38 AM Performed by: Trixie Deis A Pre-anesthesia Checklist: Patient identified, Timeout performed, Emergency Drugs available, Suction available and Patient being monitored Patient Re-evaluated:Patient Re-evaluated prior to inductionOxygen Delivery Method: Circle system utilized Preoxygenation: Pre-oxygenation with 100% oxygen Intubation Type: IV induction Ventilation: Mask ventilation without difficulty Tube type: Oral Tube size: 8.0 mm Number of attempts: 1 Airway Equipment and Method: Rigid stylet,  Video-laryngoscopy and LTA kit utilized Placement Confirmation: ETT inserted through vocal cords under direct vision,  breath sounds checked- equal and bilateral and positive ETCO2 Secured at: 22 cm Tube secured with: Tape Dental Injury: Teeth and Oropharynx as per pre-operative assessment

## 2013-09-04 NOTE — Anesthesia Postprocedure Evaluation (Signed)
  Anesthesia Post-op Note  Patient: Gabriel Russell  Procedure(s) Performed: Procedure(s): ANTERIOR CERVICAL DECOMPRESSION/DISCECTOMY FUSION CERVICAL THREE-FOUR, CERVICAL FOUR-FIVE, CERVICAL FIVE-SIX, CERVICALSIX-SEVEN  (N/A)  Patient Location: PACU  Anesthesia Type:General  Level of Consciousness: awake  Airway and Oxygen Therapy: Patient Spontanous Breathing  Post-op Pain: mild  Post-op Assessment: Post-op Vital signs reviewed, Patient's Cardiovascular Status Stable, Respiratory Function Stable, Patent Airway, No signs of Nausea or vomiting and Pain level controlled  Post-op Vital Signs: Reviewed and stable  Complications: No apparent anesthesia complications

## 2013-09-04 NOTE — Progress Notes (Signed)
Pt moving around in the bed and pulled JP drain out. Dressing changed, incision clean, dry, and intact. Will continue to monitor Pt. Rema Fendt, RN

## 2013-09-04 NOTE — H&P (Signed)
  Gabriel Russell is an 58 y.o. male.   Chief Complaint: Neck pain HPI: The patient is a 58 year old gentleman who was evaluated in the office for pain in his neck which radiates of a spinal electrical type feeling. He reportedly fell down some stairs in August is that Ms. Thompson's that time. He would the emergency room was noted to have hand weakness. Had a CT scan of the neck but no MRI scan. Over time his hand got better he still has elected to see feeling going down the spine. Came for evaluation at the request of his medical Dr. After evaluation of the patient underwent MRI scan. It showed severe stenosis C3-4 C4-5 C5-6 and C6-7 with 2 levels of spinal cord contusion. After discussing the options and the risks of treatment versus nontreatment the patient requested surgery now comes for a 4 level anterior cervical discectomy with fusion and plating. I had a long discussion with him regarding the risks and benefits of surgical intervention. The risks discussed include but are not limited to bleeding infection weakness numbness paralysis spinal fluid leak coma quadriplegia hoarseness and death. We have discussed alternative methods of therapy although risks and benefits of nonintervention. He's had the opportunity to ask numerous questions and appears to understand. With this information in hand he has requested we proceed with surgery.  Past Medical History  Diagnosis Date  . Hypertension   . Arthritis   . Concussion   . Sleep apnea     cpap     >3 yrs    Past Surgical History  Procedure Laterality Date  . Brain surgery    . Joint replacement      lt    No family history on file. Social History:  reports that he has never smoked. He does not have any smokeless tobacco history on file. He reports that he drinks alcohol. He reports that he does not use illicit drugs.  Allergies:  Allergies  Allergen Reactions  . Amoxicillin     sweats    Medications Prior to Admission  Medication Sig  Dispense Refill  . amLODipine-valsartan (EXFORGE) 10-320 MG per tablet Take 1 tablet by mouth daily.      . fentaNYL (DURAGESIC - DOSED MCG/HR) 25 MCG/HR patch Place 25 mcg onto the skin every 3 (three) days.      Marland Kitchen lisinopril-hydrochlorothiazide (PRINZIDE,ZESTORETIC) 20-25 MG per tablet Take 1 tablet by mouth daily.      Marland Kitchen oxyCODONE-acetaminophen (PERCOCET) 10-325 MG per tablet Take 1 tablet by mouth 2 (two) times daily as needed for pain (arthritis).      . sildenafil (VIAGRA) 50 MG tablet Take 50 mg by mouth daily as needed for erectile dysfunction.        No results found for this or any previous visit (from the past 48 hour(s)). No results found.  Positive for high blood pressure leg pain and leg weakness  Blood pressure 152/88, pulse 70, temperature 98.5 F (36.9 C), temperature source Oral, resp. rate 20, weight 113.399 kg (250 lb), SpO2 99.00%.  The patient is awake or and oriented. His no facial asymmetry. His gait is normal. He has a decreased reflexes. Sensation is intact and strength is 5 over 5 in all muscle groups tested Assessment/Plan Impression is that of severe severe spinal stenosis at multiple levels of his neck. The plan is for a multilevel decompressive anterior cervical discectomy with interbody fusion and cervical plating  Reinaldo Meeker, MD 09/04/2013, 11:19 AM

## 2013-09-05 MED ORDER — CYCLOBENZAPRINE HCL 10 MG PO TABS: 10.0000 mg | ORAL_TABLET | Freq: Three times a day (TID) | ORAL | Status: DC | PRN

## 2013-09-05 MED ORDER — HYDROCODONE-ACETAMINOPHEN 5-325 MG PO TABS: 1.0000 | ORAL_TABLET | Freq: Four times a day (QID) | ORAL | Status: DC | PRN

## 2013-09-05 NOTE — Progress Notes (Signed)
Pt doing well. Pt given D/C instructions with Rx's, verbal understanding was given by Pt. Pt's IV and JP drain was D/C'd prior to D/C. Pt D/C'd home via wheelchair @ 1020 per MD order. Rema Fendt, RN

## 2013-09-05 NOTE — Discharge Summary (Signed)
Physician Discharge Summary  Patient ID: Gabriel Russell MRN: [id number] DOB/AGE: 04-Aug-1955 58 y.o.  Admit date: 09/04/2013 Discharge date: 09/05/2013  Admission Diagnoses:Spinal stenosis C3-4 C4-5 C5-6 C6-7 with spinal cord contusion C3-4 and C4-5   Discharge Diagnoses: Spinal stenosis C3-4 C4-5 C5-6 C6-7 with spinal cord contusion C3-4 and C4-5  Active Problems:   Cervical stenosis of spinal canal   Discharged Condition: good  Hospital Course: Mr. Kleinman was admitted and taken to the operating room for an uncomplicated ACDF for myelopathy and stenosis at C3-7. Post op he has been swallowing well. He has voided, ambulated, and tolerated a regular diet. He is moving all extremities at his preop baseline.   Consults: None  Significant Diagnostic Studies: none  Treatments: surgery: Decompressive anterior cervical discectomy C3-4 C4-5 C5-6 C6-7 with trabecular metal interbody fusion and Trinica anterior cervical plating   Discharge Exam: Blood pressure 144/91, pulse 90, temperature 97.3 F (36.3 C), temperature source Oral, resp. rate 18, weight 113.399 kg (250 lb), SpO2 95.00%. General appearance: alert, cooperative, appears stated age and no distress Neurologic: Mental status: Alert, oriented, thought content appropriate Motor: moving all extremities ~5/5      Medication List         amLODipine-valsartan 10-320 MG per tablet  Commonly known as:  EXFORGE  Take 1 tablet by mouth daily.     cyclobenzaprine 10 MG tablet  Commonly known as:  FLEXERIL  Take 1 tablet (10 mg total) by mouth 3 (three) times daily as needed for muscle spasms.     fentaNYL 25 MCG/HR patch  Commonly known as:  DURAGESIC - dosed mcg/hr  Place 25 mcg onto the skin every 3 (three) days.     HYDROcodone-acetaminophen 5-325 MG per tablet  Commonly known as:  NORCO/VICODIN  Take 1-2 tablets by mouth every 6 (six) hours as needed for moderate pain.     lisinopril-hydrochlorothiazide 20-25 MG per tablet   Commonly known as:  PRINZIDE,ZESTORETIC  Take 1 tablet by mouth daily.     oxyCODONE-acetaminophen 10-325 MG per tablet  Commonly known as:  PERCOCET  Take 1 tablet by mouth 2 (two) times daily as needed for pain (arthritis).     sildenafil 50 MG tablet  Commonly known as:  VIAGRA  Take 50 mg by mouth daily as needed for erectile dysfunction.         Signed: Danise Dehne L 09/05/2013, 9:29 AM

## 2013-09-08 ENCOUNTER — Encounter (HOSPITAL_COMMUNITY): Payer: Self-pay | Admitting: Neurosurgery

## 2014-09-15 ENCOUNTER — Ambulatory Visit
Admit: 2014-09-15 | Disposition: A | Discharge status: Home / Self Care | Payer: Self-pay | Attending: Internal Medicine | Admitting: Internal Medicine

## 2014-09-25 NOTE — Consult Note (Signed)
Brief Consult Note: Diagnosis: Multifactorial delirium (head trauma, alcohol withdrawal, UTI).   Patient was seen by consultant.   Consult note dictated.   Recommend further assessment or treatment.   Orders entered.   Discussed with Attending MD.   Comments: The patient is alert and fully oriented, pleasant, polite, cooperative.  PLAN:  1. The patient no longer mets criteria for IVC. I will terminate proceedings. Please dischargea as appropriate.   2. Alcohol abuse-the patient declines substance abuse treatment. D/C Ativan.  Electronic Signatures: Kristine Linea (MD)  (Signed 12-Aug-14 13:00)  Authored: Brief Consult Note   Last Updated: 12-Aug-14 13:00 by Kristine Linea (MD)

## 2014-09-25 NOTE — Consult Note (Signed)
PATIENT NAME:  Gabriel Russell, Gabriel Russell MR#:  [id number] DATE OF BIRTH:  [birth year]  DATE OF ADMISSION: 01/12/2013  DATE OF CONSULTATION:  01/13/2013  CONSULTING PHYSICIAN:  Hezzie Karim B. Jennet Maduro, MD  REQUESTING PHYSICIAN:  Dr. Gery Pray  IDENTIFYING DATA:   Gabriel Russell is a 59 year old male with no past psychiatric history.   CHIEF COMPLAINT:  "Give me my clothes and I will leave now."   HISTORY OF PRESENT ILLNESS:  Gabriel Russell was admitted to medical floor and CCU after a fall. He was found by his neighbor at the staircase, unconscious. He has a big bump on his head. Per his sister's report, he had been drinking prior to his fall, even though his blood alcohol level on admission is undetectable. The patient was found to have altered mental status, and was admitted to CCU. At CCU, he has been belligerent and trying to leave against medical advice. Psychiatry was asked to evaluate the capacity to make decisions about his discharge. The patient tells me that he does not want to be at [facility]. He believes that in March, when he slipped on ice and was brought to our hospital, he was not treated well and had to go to [facility], where they saved his life. Indeed, the patient was admitted to [facility] for cerebral hemorrhage, and stayed there for a month. Per family report, he also tried to leave against medical advice on multiple occasions, and had to be restrained. Per sister, he returned to normal, but became irritable following his traumatic brain injury. She believes that the patient needs to stay in our hospital for treatment, and that we need to restrain him if he refuses. The patient has no specific complaints about current hospitalization, but he pulled out his IV today, feeling that we were hurting him by sticking needles. He is very irritated with me and avoids answering questions. He is unable to have a discussion about his current condition, risks and benefits of treatment versus leaving against  medical advice. He insists on leaving the hospital immediately. He seemed not to understand what the problem is, and is focused on site of  IV infusion. He does not want to discuss substance abuse, but tells me that drinking is not a crime.   PAST PSYCHIATRIC HISTORY:  Reportedly he has never seen a psychiatrist. There were no hospital admissions. No suicide attempts. Reportedly he was never treated for substance abuse but, per his sister, he has been drinking and using drugs some.   FAMILY PSYCHIATRIC HISTORY:  None reported.   PAST MEDICAL HISTORY:  Dyslipidemia, hypertension, arthritis, status post head injury, status post brain hemorrhage.   ALLERGIES:  AMOXICILLIN.   MEDICATIONS ON ADMISSION:  None.   MEDICATIONS AT TIME OF CONSULTATION: Tylenol as needed, Toradol injection every 6 hours as needed, Levaquin 250 mg IV daily, lidocaine patch.   SOCIAL HISTORY:  He lives by himself, per sister, but the patient tells me that he lives with his 83 year old son. I do not know about his employment, he does have Medicaid.   REVIEW OF SYSTEMS:  Difficult to obtain. The patient complains of pain at the site of IV infusions. The IVs are disconnected and has a Band-Aid there.    PHYSICAL EXAMINATION: VITAL SIGNS:  Blood pressure 115/69, pulse 60, respirations 23, temperature 97.9.  GENERAL: This is well-developed, well-built, tall man with a bump on his head, agitated and fidgety. The rest of the physical examination is deferred to his primary attending.  LABORATORY DATA:  Chemistries are within normal limits, except for blood glucose of 110. LFTs within normal limits, except for AST of 66. Troponin less than 0.02. Urine tox screen positive for benzodiazepines and cannabinoids. CBC within normal limits. Urinalysis with 3+ leukocyte esterase and 300 white cells per field. EKG:  Normal sinus rhythm, borderline voltage criteria for LVH, borderline EKG.   MENTAL STATUS EXAMINATION:  The patient is  alert. He is oriented to person and place. He knows it is August 2014. He is marginally cooperative and unwilling to answer questions, or very irritated with me about asking him again. He maintains limited eye contact. He wears a hospital gown. He is sitting in the chair, moving constantly, trying to get out of the chair and pulling on pieces of equipment. He seems unsteady, even though he is not walking. Per nurse, he has not been able to walk properly. His speech is slurred. His mood is fine. Affect irritable. Thought process is logical, but slow. Thought content:  He denies thoughts of hurting himself. There are no delusions or paranoia. He auditory or visual hallucinations. His cognition is impaired. His insight and judgment are extremely poor.   DIAGNOSES: AXIS I:  Delirium, multifactorial, likely due to urinary tract infection, head injury, and possibly alcohol withdrawal, alcohol abuse, benzodiazepine abuse, marijuana abuse.   AXIS II:  Deferred.   AXIS III: Status post head injury, status post brain hemorrhage, dyslipidemia, hypertension, arthritis.   AXIS IV:  Medical problems, substance abuse, primary support.   AXIS V:  GAF 35.   PLAN:   1.  The patient is absolutely unable to discuss his problems. He insists on leaving the hospital and is threatening to do it whether we like it or not. He, however, has enough sense to demand his clothes. He does not have the capacity to make a decision about discharge.  2.  It is unclear how much he has been drinking. Will start very low dose of Ativan 0.5 mg 4 times daily, just in case he needs alcohol detox. Benzodiazepines will not help to clear his delirium.   3.  I will put this patient on involuntary commitment, his IVC papers are on chart.   4.  He may need a one-to-one sitter as necessary.   I will follow up.     ____________________________ Ellin Goodie. Jennet Maduro, MD jbp:mr D: 01/13/2013 18:46:43 ET T: 01/13/2013 20:22:08  ET JOB#: [id number]  cc: Margurite Duffy B. Jennet Maduro, MD, <Dictator> Shari Prows MD ELECTRONICALLY SIGNED 01/18/2013 15:05

## 2014-09-25 NOTE — Op Note (Signed)
PATIENT NAME:  Gabriel Russell, Gabriel Russell MR#:  [id number] DATE OF BIRTH:  05/13/1956  DATE OF PROCEDURE:  04/18/2013  PREOPERATIVE DIAGNOSIS: Severe degenerative arthritis, left knee.   POSTOPERATIVE DIAGNOSIS: Severe degenerative arthritis, left knee.   PROCEDURE: Left total knee arthroplasty.   SURGEON: Myra Rude, MD   ASSISTANT: Juanell Fairly, MD   ANESTHESIA: Spinal.   COMPLICATIONS: None.   TOURNIQUET TIME: 124 minutes.   DRAINS: Two Autovac.   DESCRIPTION OF PROCEDURE: After adequate induction of spinal anesthesia, the tourniquet is applied to the left lower extremity over the proximal thigh. Foley catheter is inserted. The left knee and leg are thoroughly prepped with alcohol and ChloraPrep and draped in standard sterile fashion. The extremity is wrapped out with the Esmarch bandage and pneumatic tourniquet elevated to 350 mmHg. Standard longitudinal incision is made over the patella and the dissection carried down and the medial and lateral retinaculum dissected out. Medial parapatellar incision is made. The knee is then flexed and the patella reflected laterally. Standard synovial and osteophyte debridement is performed. The proximal medial release is performed using the periosteal elevator over the medial tibial plateau, and the ligaments are seen to be symmetric. The proximal tibia is revealed with the retractors. The proximal tibial cutting block is put into position and pinned, and the guide demonstrates accurate alignment. Proximal tibial cut is made, and all residual meniscus and proximal tibia is removed as per the cut. The proximal femur is sized as a large plus. The central drill hole is made. Femoral rotational guide, 10 mm in width, is put into position and is seen to be a good fit with the knee flexed. Cutting guide is pinned, and then both the anterior and posterior cuts are made. The 5 degree valgus femoral cutting block for the distal femur is impacted into place and pinned  and the distal femoral cut made. This had to be adjusted 2 mm deeper. The 10 mm extension block is put into position, and there is seen to be excellent fit and excellent alignment. The distal femur shaping guide is impacted into position, and the appropriate cuts and drill holes are made. The proximal tibia is then revealed with the retractors, and the +5 tibial tray is pinned into place and the appropriate central hole made with the small cone. Trial components are then impacted into place and seen to be an excellent fit, and there is full extension with perhaps some slight hyperextension. The knee is stable to stress testing in all directions. Patella is prepared in the usual manner. All trial components are then removed. The wound is then irrigated multiple times with the pulsatile lavage. The LCS +5 tibial tray is then cemented into place. The rotating platform, 10 mm, is put into place. The porous-coated large plus LCS complete femur is impacted into place, as well as the LCS complete porous-coated large plus patella is impacted into place. All components are seen to be in good position. The wound is thoroughly irrigated multiple times again. Two Autovac drains are brought out through separate stab wound incisions. The retinaculum is carefully and securely repaired with 2-0 Ethibond. Subcutaneous tissue is closed with 3-0 Vicryl, and the skin is closed with the skin stapler. A soft bulky dressing is applied with a Polar Care and a knee immobilizer, and the patient is returned to the recovery room in satisfactory condition having tolerated the procedure quite well.    ____________________________ Clare Gandy, MD ces:jcm D: 04/18/2013 15:03:06 ET T: 04/18/2013 22:05:47 ET  JOB#: [id number]  cc: Clare Gandy, MD, <Dictator> Clare Gandy MD ELECTRONICALLY SIGNED 04/19/2013 13:21

## 2014-09-25 NOTE — Discharge Summary (Signed)
PATIENT NAME:  Gabriel Russell, Gabriel Russell MR#:  [id number] DATE OF BIRTH:  [birth year]  DATE OF ADMISSION:  01/12/2013 DATE OF DISCHARGE:  01/14/2013  DISCHARGE DIAGNOSES: 1.  Fall due to drug use causing nondisplaced fracture of the left orbital wall.  No complaints. No intervention.  2.  Urinary tract infection.  3.  Drug abuse.   CONDITION ON DISCHARGE: Stable.   CODE STATUS: Full code.   MEDICATIONS ON DISCHARGE:  2.  Levaquin 500 mg every 24 hours for 4 days.   DIET  ON DISCHARGE: Low sodium, diet consistency regular.   ACTIVITY: As tolerated.   TIME FRAME TO FOLLOWUP:  Within 2 to 4 weeks. Advised to have routine follow-ups with primary care physician.   HISTORY OF PRESENT ILLNESS: 74.   A 59 year old who male was brought into the ER, found to have fallen and was found at the bottom of the stairs by a friend. The patient lives alone and unable to give any history of what happened at the time of presentation. Apparently he fell down a flight of stairs. In the ER, he was arousable, able to identify who he is and where he is, but he was very sleepy and going back and forth to sleep.  The patient agreed to having a drink occasionally, but there was no alcohol found in his blood on the day of presentation. There was no incontinence also when he fell down.  The patient had 3 months ago, a significant fall and he suffered from cerebral hemorrhage, admitted to [facility] trauma center due to falling on ice and he was hospitalized approximately a month. CT of the head in the ER showed nondisplaced fracture of the medial aspect of the left orbital roof which may communicate with frontal sinus but no frontal sinus air fluid level and the patient did not have any eye symptoms, any abnormal eye movements or change in the region so he was kept under observation for any extension of his bleeding. After 18 hours, the patient became more alert but he was very upset with the service wanted to sign out against medical advice.  Due to his sleepiness and weakness, a consult was called in to check his capability before letting him sign out AMA and they confirmed that the patient does not have capacity and put him on involuntary commitment. After one more day, the patient was fully alert and oriented and he was strong enough to walk and he understands everything so Psych was reconsulted and they confirmed the patient being more alert and having capacity to make decisions and so we discharged the patient home.  Other medical issues in this course for his left orbital fracture,  ENT consult was called in and they advised to check extraocular movements when patient is more alert and check the vision. If the patient is having examination normal than for orbital fracture nothing more needs to be done.  CT of the head was repeated after 18 hours and there was no extra bleeding or any changes noted.  2.  UTI. The patient's UA was positive so we started him on levofloxacin and discharged on the same.  3.  Drug abuse.  Psych consult was called in and there was no acute need of going for rehab. We advised the patient not to do drugs.    IMPORTANT LABORATORY RESULTS IN THE HOSPITAL: WBC 9.1, hemoglobin 14.8, platelet count 181, BUN 14, creatinine 0.89, sodium 137, potassium 4.0. Troponin less than 0.02, urinalysis positive with 13 WBC  and 3+ leukocyte esterase. Urine for toxicology was positive for cannabinoids and benzodiazepine. Blood culture: No growth. Urine culture: No growth. Repeat CT head: No changes.   TOTAL TIME SPENT ON THIS DISCHARGE: 40 minutes.    ____________________________ Hope Pigeon Elisabeth Pigeon, MD vgv:dp D: 01/17/2013 07:26:18 ET T: 01/17/2013 07:46:19 ET JOB#: [id number]  cc: Hope Pigeon. Elisabeth Pigeon, MD, <Dictator> Altamese Dilling MD ELECTRONICALLY SIGNED 02/04/2013 0:39

## 2014-09-25 NOTE — H&P (Signed)
PATIENT NAME:  Gabriel Russell, Gabriel Russell MR#:  [id number] DATE OF BIRTH:  August 06, 1955  DATE OF ADMISSION:  01/12/2013  ADDENDUM I did discuss the patient's CT maxillofacial with ENT, who states that as long as the patient is moving his eyes, assuring no evidence of entrapment, he does not need any intervention. On my examination, the patient does move his eyes within his orbit with no difficulty. However, this is a suboptimal exam, given the patient's somnolence from his concussion. I would recommend a better examination in the morning. The patient currently does not complain of double vision.      ____________________________ Gery Pray, MD dc:mr D: 01/12/2013 17:59:31 ET T: 01/12/2013 21:06:20 ET JOB#: [id number]  cc: Gery Pray, MD, <Dictator> Gery Pray MD ELECTRONICALLY SIGNED 01/24/2013 11:02

## 2014-09-25 NOTE — Discharge Summary (Signed)
PATIENT NAME:  Gabriel Russell, Gabriel Russell MR#:  [id number] DATE OF BIRTH:  07/06/1955  DATE OF ADMISSION:  04/18/2013 DATE OF DISCHARGE:  04/22/2013  DISCHARGE DIAGNOSES: 1. Severe degenerative arthritis, left knee.  2. Hypertension.  3.  Sleep apnea.  4. History of subdural hematoma.   OPERATIONS AND PROCEDURES PERFORMED: LCS left total knee arthroplasty on 04/18/2013.   HISTORY AND PHYSICIAN: Is as written on admission.   LABORATORY DATA: As noted in the chart.   HOSPITAL COURSE: Following appropriate informed consent, the patient was taken to the Operating Room on 04/24/2013 where left total knee arthroplasty was performed without difficulty. The patient tolerated the procedure quite well and had good pain control following the procedure. He was begun on physical therapy and was on Lovenox during his hospitalization as prophylaxis for venous thrombosis and pulmonary embolism. He was begun on physical therapy for range of motion of the knee and ambulation with a walker full weight-bearing. He tolerated this quite well. On 04/22/2013 he was discharged to his home with home health physical therapy to resume total knee protocol. He may be full weight-bearing. He was discharged on his previous medications and, in addition, is given a prescription for Percocet 1 tablet every three hours as necessary for pain and aspirin 325 mg per day twice a day after meals as prophylaxis for venous thrombosis and pulmonary embolism. He may shower and replace the dressing as necessary. He is to return to the office in 10 days to see Dr. Katrinka Blazing.  ____________________________ Clare Gandy, MD ces:sg D: 05/08/2013 13:31:35 ET T: 05/08/2013 13:58:44 ET JOB#: [id number]  cc: Clare Gandy, MD, <Dictator> Clare Gandy MD ELECTRONICALLY SIGNED 05/09/2013 11:17

## 2014-09-25 NOTE — H&P (Signed)
PATIENT NAME:  Gabriel Russell, Gabriel Russell MR#:  [id number] DATE OF BIRTH:  07-Jul-1955  DATE OF ADMISSION:  01/12/2013  PRIMARY CARE PHYSICIAN:  Unknown.  CODE STATUS:  FULL CODE.   CHIEF COMPLAINT:  Fall.  HISTORY OF PRESENT ILLNESS:  This is a 59 year old male who was brought in to the ER after he was found at the bottom of the stairs by a friend. The patient lives alone. He is unable to give any history as to what happened, but he apparently fell down a flight of stairs. In the ER, the patient is arousable. He is able to identify who he is, where he is. However, he is sleepy and falls back to sleep. There is no report of any illness prior to today. There are no fevers, no chills, no nausea, no vomiting. There are no reports of any drug use. The patient does drink occasionally; however, there was no alcohol found in his system today. There was no evidence of incontinence at the site when he fell. No evidence of tongue laceration suggesting seizures. When he was found, the patient was awakened and responded to his friend.   Of note, the patient was hospitalized at [facility] approximately 3 months ago after he suffered a cerebral hemorrhage secondary to trauma (falling on ice). He was hospitalized  approximately a month, then discharged home. As stated, he lives alone, performs all of his ADLs. Per family members, he has no deficits.   history obtained mainly from friends and family members at bedside. The hospitalist has been asked to bring in patient for observation for concussion.   In the ER, the patient has not received any pain medications. He is also on no significant medications at home.   REVIEW OF SYSTEMS:  Unable to obtain due to patient's mentation.   PAST MEDICAL HISTORY:  Includes dyslipidemia, hypertension, arthritis, recent cerebral hemorrhage.   PAST SURGICAL HISTORY:  Includes craniotomy.  MEDICATIONS:  NSAIDs p.r.n. for pain.   ALLERGIES:  No known drug allergies.   SOCIAL  HISTORY:  Negative for tobacco or drugs. Occasional social alcohol. The patient lives alone. He does not use a cane or walker. He is a FULL CODE.   FAMILY HISTORY:  Unknown at this time due to patient's mentation.   PHYSICAL EXAMINATION: VITAL SIGNS:  Blood pressure 144/87, pulse 76, respirations 20, temperature 97.8, satting 98% on room air.  GENERAL:  Alert, oriented, but somnolent gentleman. No acute distress.  EYES:  PERRLA.   EARS, NOSE, THROAT:  Dry oral mucosa. Trachea midline.  NECK:  Supple.  LUNGS:  Clear. No wheeze. No use of accessory muscles.  CARDIOVASCULAR:  Regular rate and rhythm without murmurs, rubs or gallops.  ABDOMEN:  Soft, nontender, nondistended. No organomegaly.  NEUROLOGIC AND MUSCULOSKELETAL:  Unable to accurately assess due to patient's mentation and cooperation. The patient does have some generalized sore muscle around his shoulders bilaterally and back.  SKIN:  No rashes. No subcutaneous crepitations appreciated.   LABS:  Alcohol level less than 3. INR 1.0. Troponin less than 0.02. Sodium 137, potassium 4.0, chloride 105, CO2 of 27, calcium 9.3, BUN 14, creatinine 0.89, glucose 94. LFTs are normal. White blood count 9.1, hemoglobin 14.8, platelets 181.   X-rays of the shoulder shows no evidence of any acute bony abnormality of the right shoulder. He does have some mild DJD in the right AC.   CT spine:  No evidence of acute cervical spinal fracture-dislocation.   CT head:  No evidence of  acute intracranial hemorrhage or an evolving ischemic infarct. There is mild cephalohematoma over the left forehead in association with a laceration. It is a tender, nondisplaced fracture of the medial aspect of the left orbit roof, which may communicate with a left frontal sinus.   CT maxillofacial area without contrast:  There is a tender, nondisplaced fracture involving the medial aspect of the roof of the orbit on the left. The fracture lines appear to extend to lateral  aspect of the frontal sinuses. No frontal sinus air fluid level. EKG:  Normal sinus rhythm.   ASSESSMENT AND PLAN: 1.  Concussion, with some hypersomnolence. The patient will be brought in. A urine drug screen has been ordered. The results of this is pending. The patient will be monitored in the ICU. Neuro checks will be ordered. Repeat CT head in the morning. No pain medications, no benzos will be ordered. Continue close monitoring.  2.  Nondisplaced fracture over the roof of the orbit on the left. I will discuss this with ENT. It is nondisplaced fracture, no intervention will be required.  3.  History of cerebral hemorrhage, stable.  4.  Dyslipidemia.  5.  Hypertension.  6.  Arthritis.  The patient currently on no medications for these, monitor.     ____________________________ Gery Pray, MD dc:mr D: 01/12/2013 17:36:35 ET T: 01/12/2013 20:42:28 ET JOB#: [id number]  cc: Gery Pray, MD, <Dictator> Gery Pray MD ELECTRONICALLY SIGNED 01/24/2013 11:01

## 2014-09-25 NOTE — Consult Note (Signed)
Brief Consult Note: Diagnosis: Multifactorial delirium (head trauma, alcohol withdrawal?).   Patient was seen by consultant.   Consult note dictated.   Recommend further assessment or treatment.   Orders entered.   Discussed with Attending MD.   Comments: The patient was admitted for AMS following a fall while intoxicated.   PLAN:  1. The patient does not have the capacity to make decisions about disposition.  2. Alcohol abuse-we need to monitor for sx of alcohol withdrawal.Will start low dose ativan to prevent withdrawals. This is not good for AMS.  3. I will follow along.  Electronic Signatures: Kristine Linea (MD)  (Signed 11-Aug-14 12:18)  Authored: Brief Consult Note   Last Updated: 11-Aug-14 12:18 by Kristine Linea (MD)

## 2014-11-09 IMAGING — CR DG SHOULDER 3+V*L*
1 series · 3 of 3 positions shown · non-contrast
Comparison: none

REASON FOR EXAM: post fall, left shoulder pain
COMMENTS:   Bedside (portable):Y

PROCEDURE:     DXR - DXR SHOULDER LEFT COMPLETE  - January 12, 2013 [DATE]
RESULT:     Left shoulder images demonstrate no fracture, dislocation or
radiopaque foreign body.

[Series 1: internal rotate · 0.17mm/px · 3 of 3 slices shown]
[im 1/3]
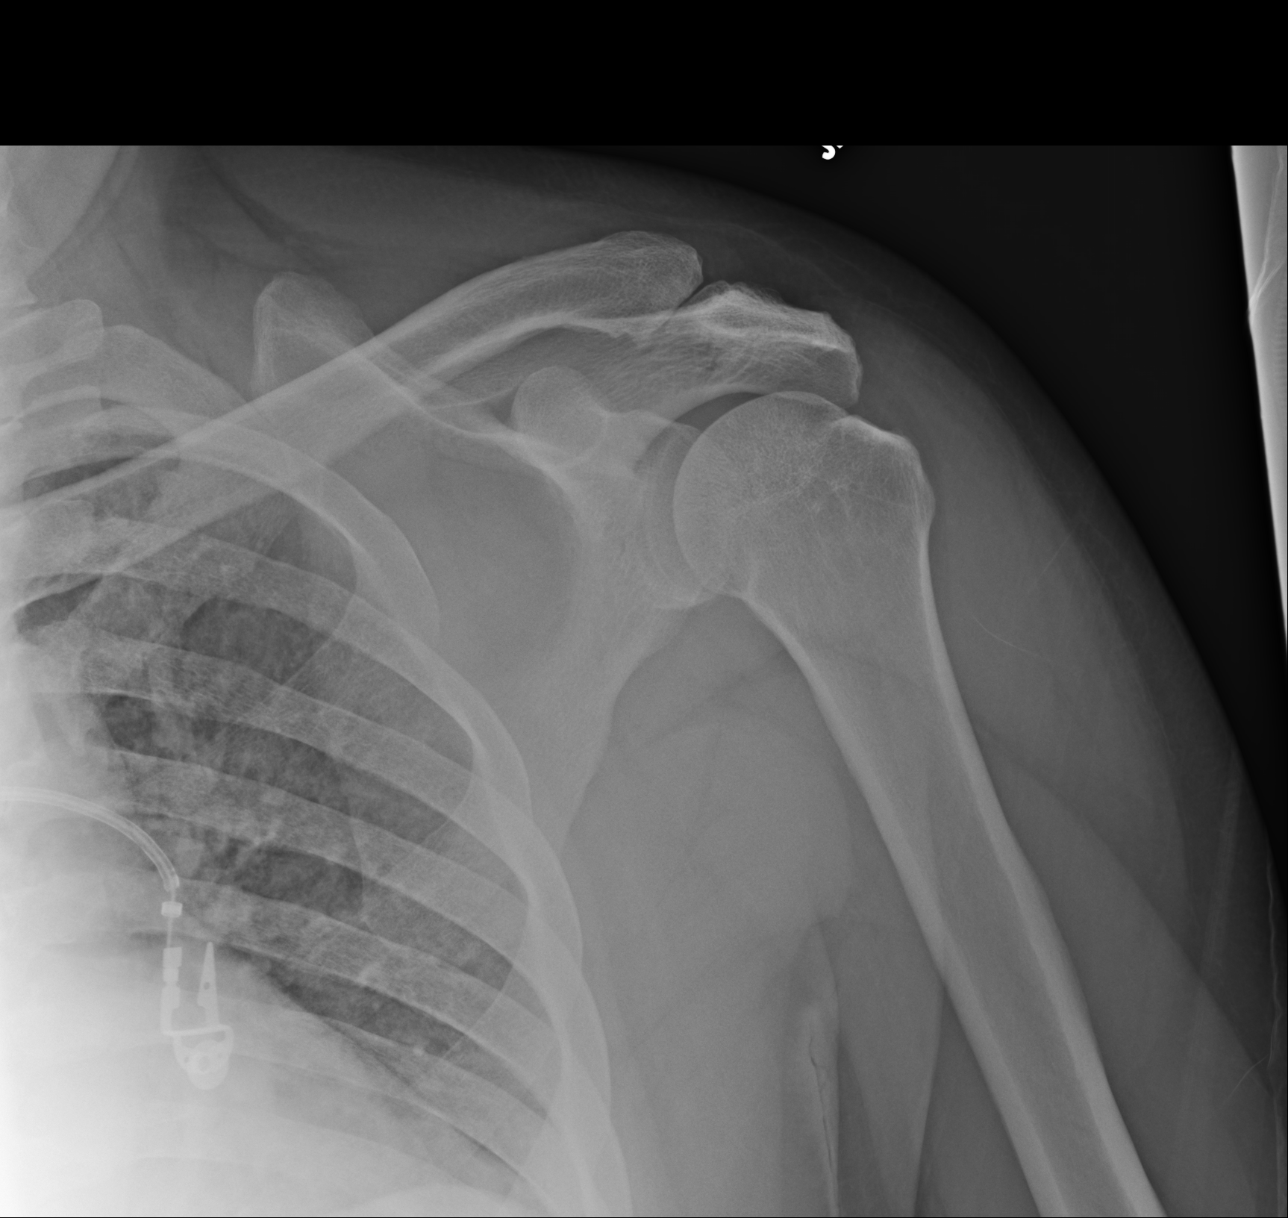
[im 2/3]
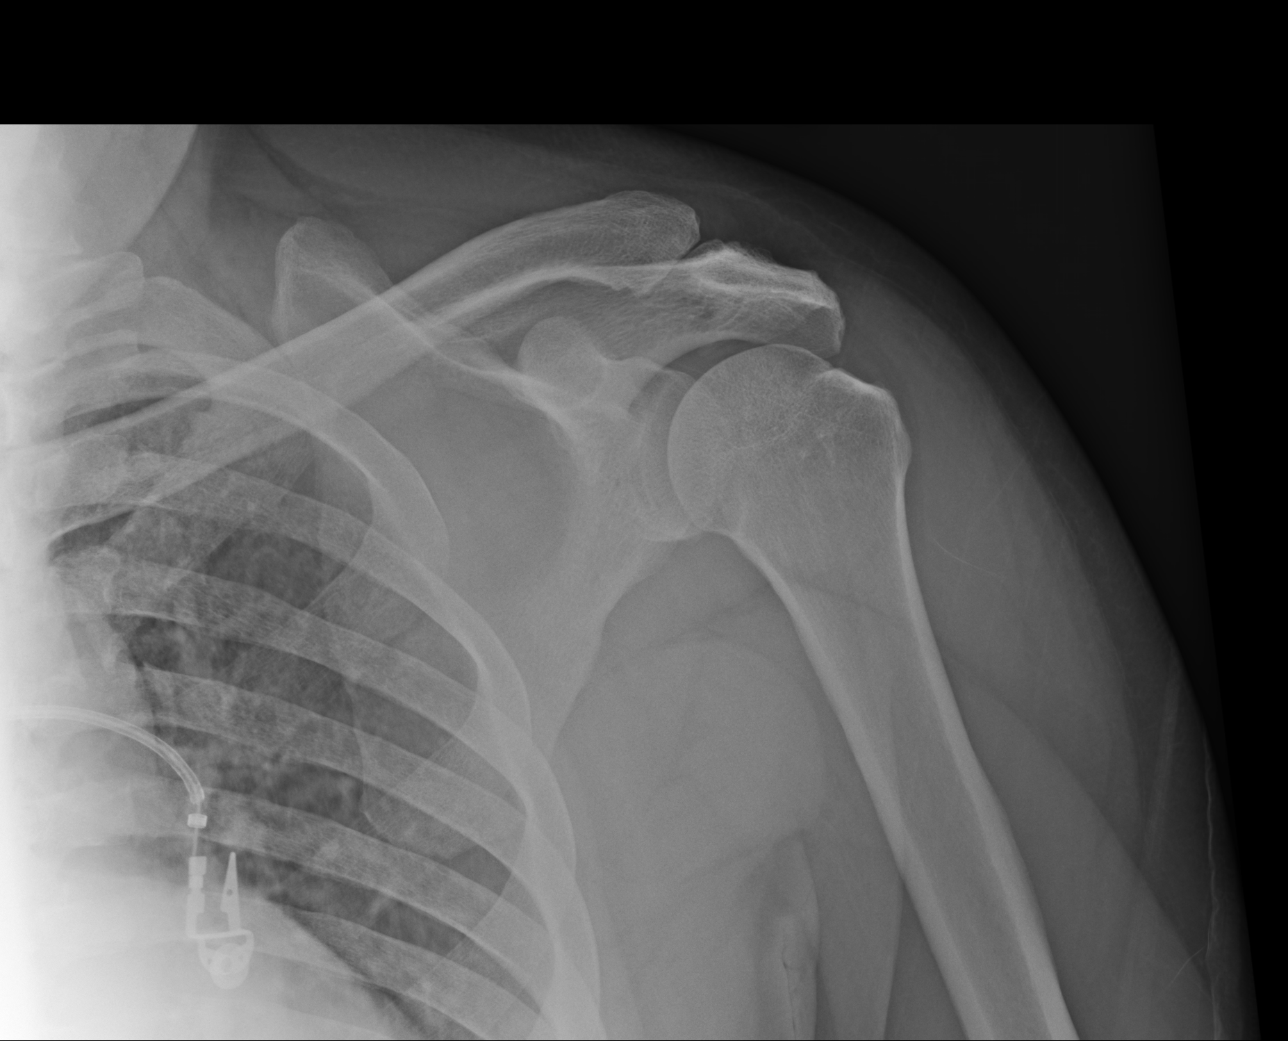
[im 3/3]
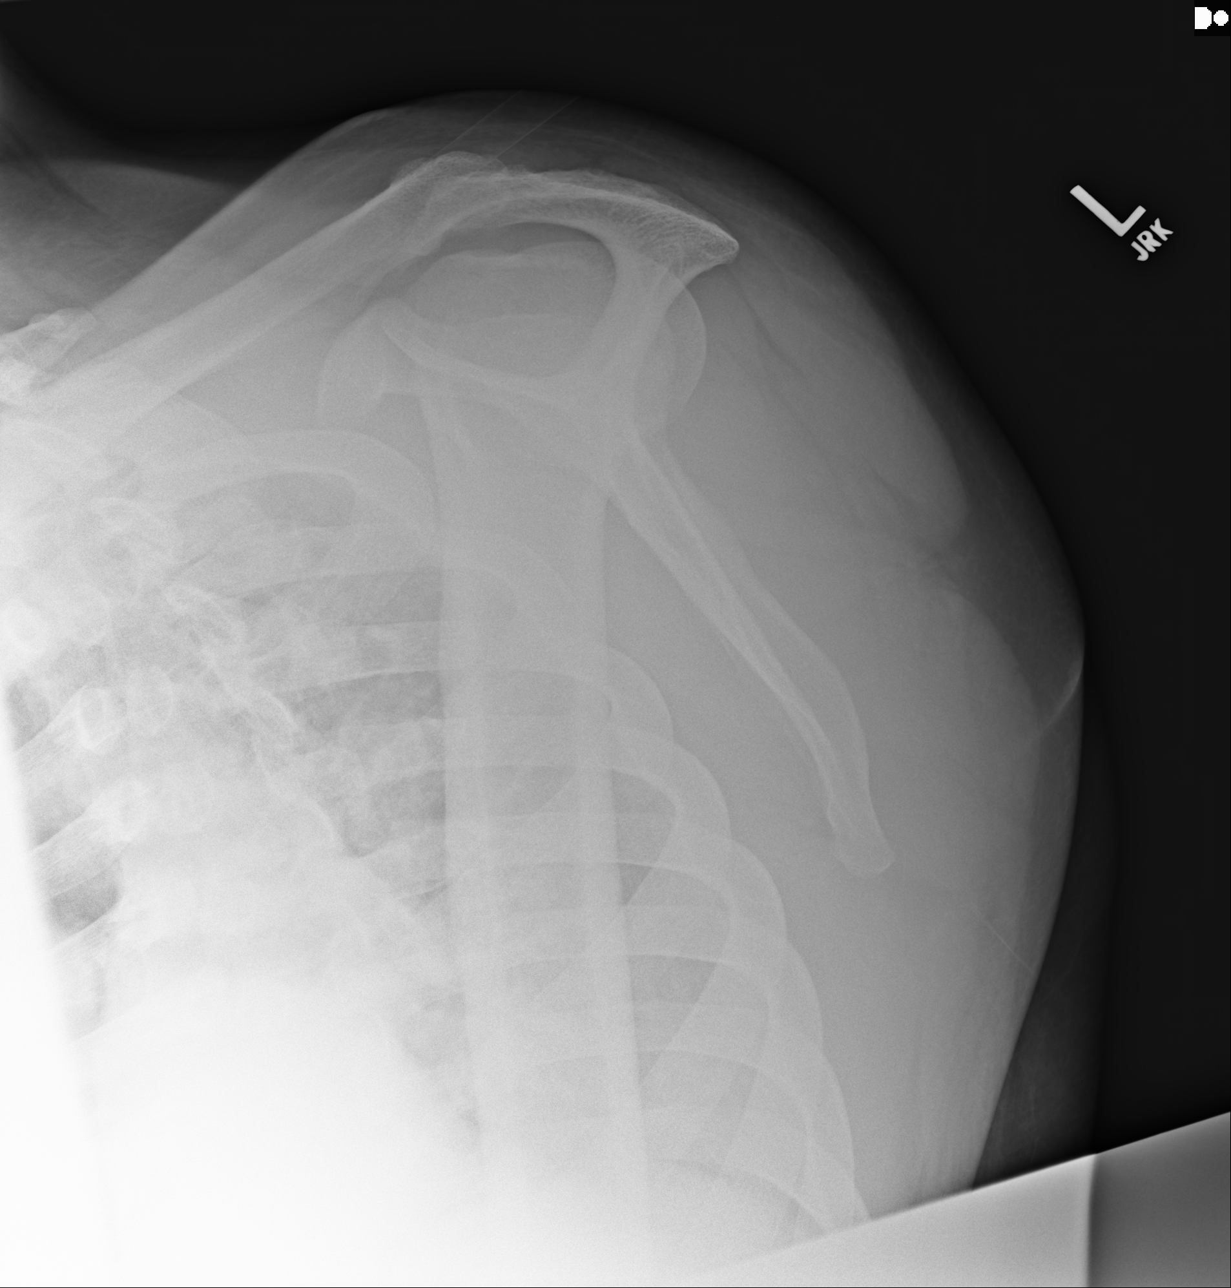

[3 of 3 positions shown; findings below may reference images not displayed]

IMPRESSION: Please see above.

[REDACTED]

## 2014-11-09 IMAGING — CT CT CERVICAL SPINE WITHOUT CONTRAST
1 series · 12 of 14 positions shown, 15 images · non-contrast
Comparison: none

REASON FOR EXAM: Fall
COMMENTS:   May transport without cardiac monitor

[Series 6: axial · axial · 0.34mm/px · z∈[-292,-121]mm · 12 of 110 slices shown, 15 images]
[im 9/110  soft-tissue]
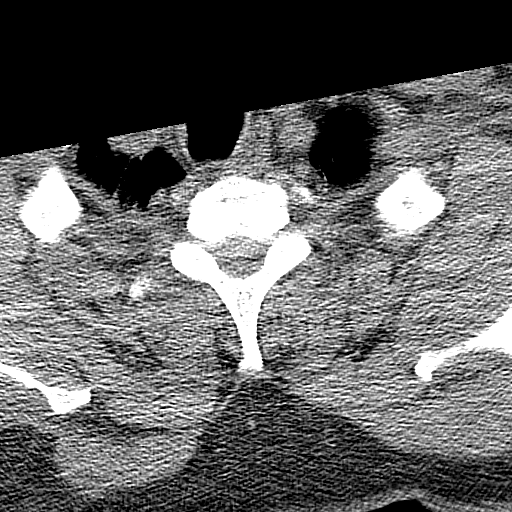
[im 9/110  bone]
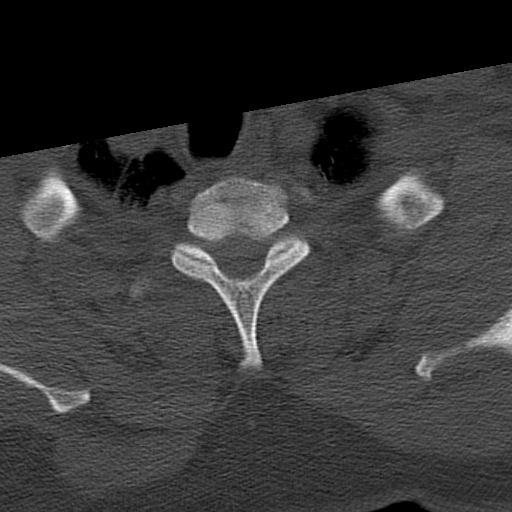
[im 17/110  bone]
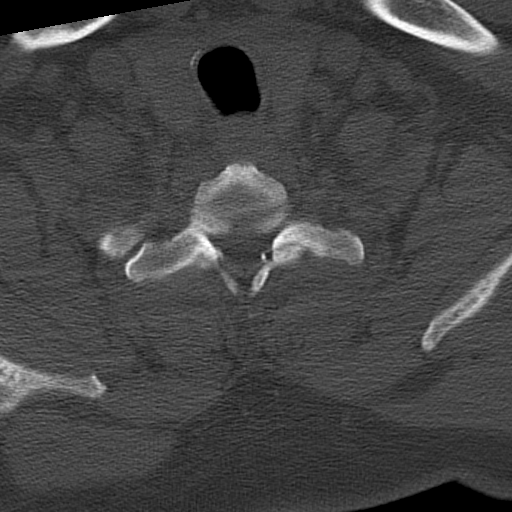
[im 26/110  bone]
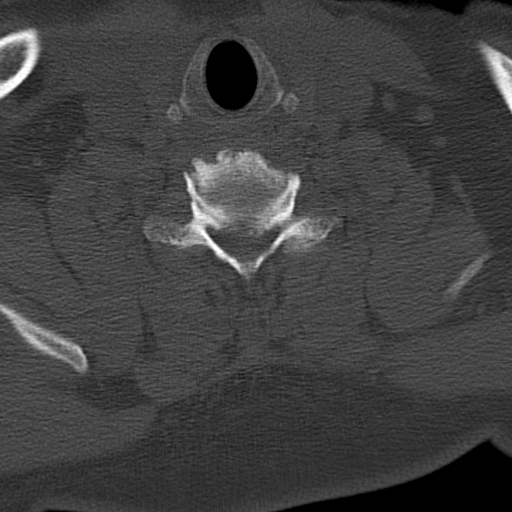
[im 34/110  bone]
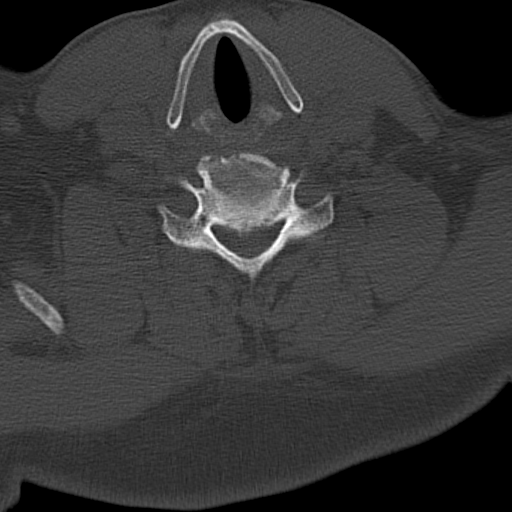
[im 42/110  soft-tissue]
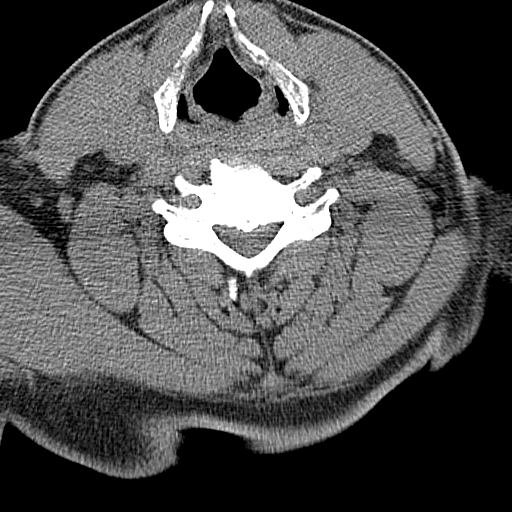
[im 42/110  bone]
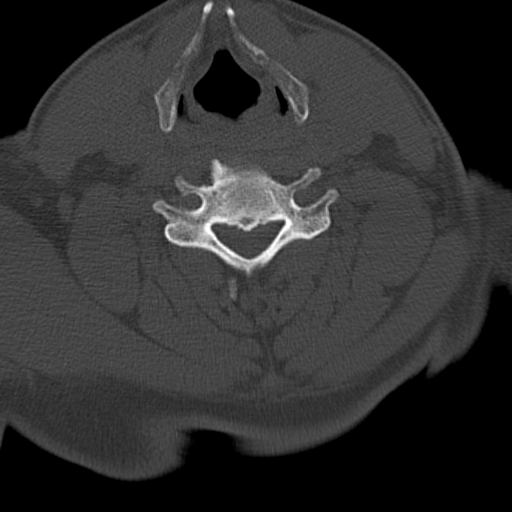
[im 51/110  bone]
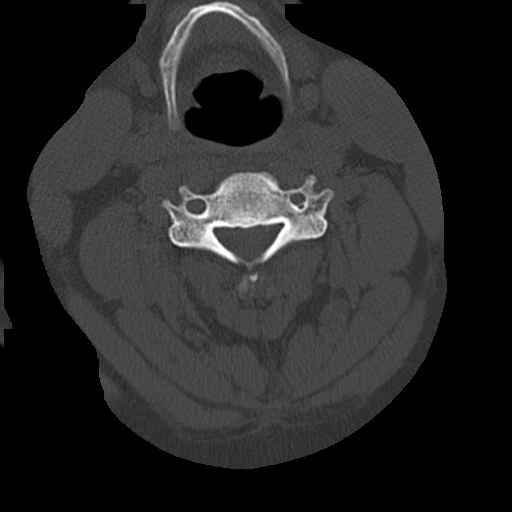
[im 59/110  bone]
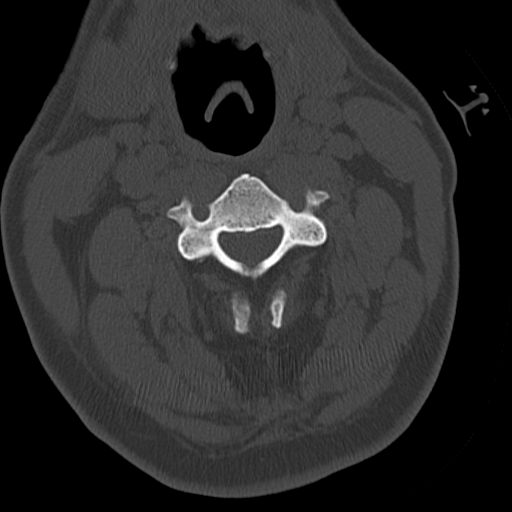
[im 68/110  bone]
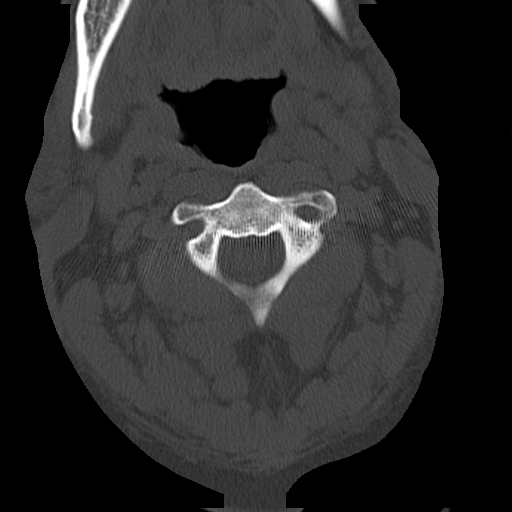
[im 76/110  soft-tissue]
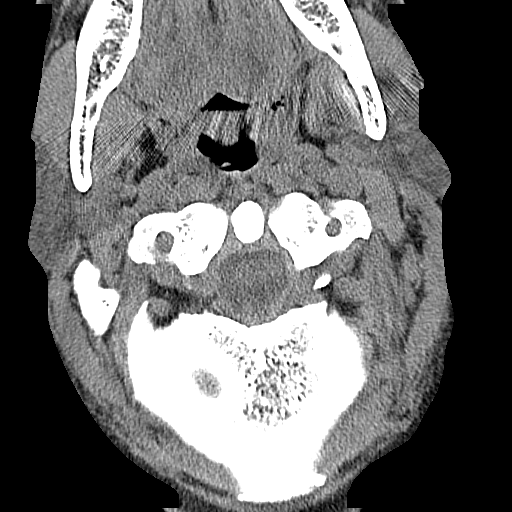
[im 76/110  bone]
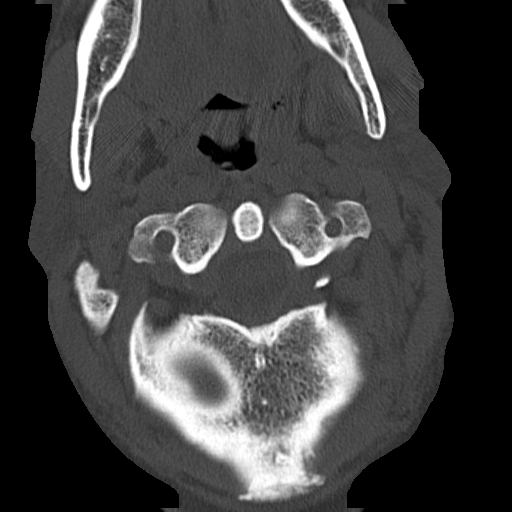
[im 84/110  bone]
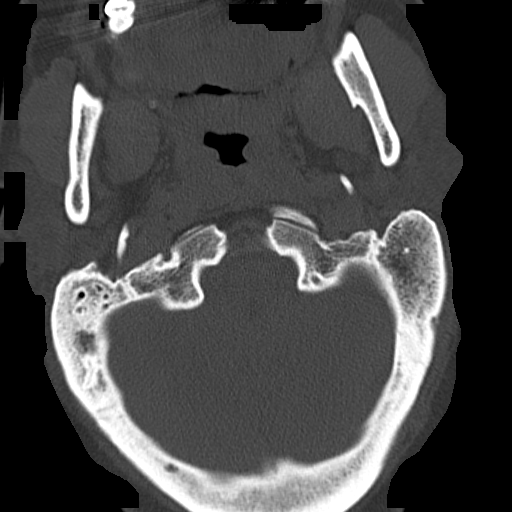
[im 93/110  bone]
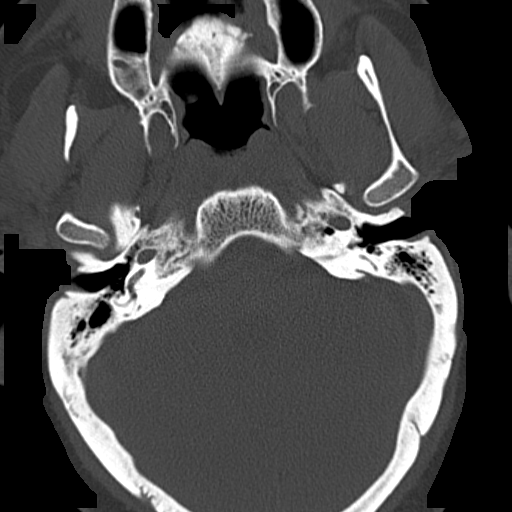
[im 101/110  bone]
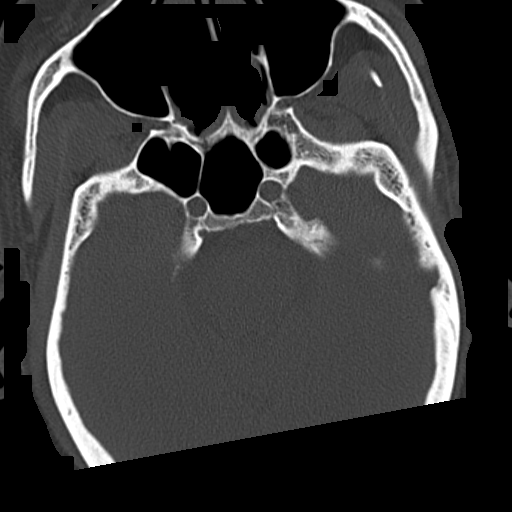

[12 of 14 positions shown; findings below may reference images not displayed]

PROCEDURE:     CT  - CT CERVICAL SPINE WO  - January 12, 2013  [DATE]

RESULT:     Sagittal, axial, and coronal images through the cervical spine
are reviewed.

The cervical vertebral bodies are preserved in height. The prevertebral soft
tissue spaces appear normal. There is mild narrowing of mid cervical disc
spaces with anterior and posterior osteophytes. There is no evidence of a
perched facet or spinous process fracture. The odontoid is intact and the
lateral masses of C1 align normally with those of C2. The bony ring at each
cervical level is intact. There are emphysematous changes and scarring in
the pulmonary apices.
IMPRESSION: 1. There is no evidence of an acute cervical spine fracture nor dislocation.
2. There are mild degenerative disc changes in the mid cervical spine. There
are variable degrees of moderate to severe neural foraminal encroachment due
to endplate osteophyte and facet joint osteophyte.

[REDACTED]

## 2015-06-23 IMAGING — CR DG CHEST 2V
2 series · 2 of 2 positions shown · non-contrast
Comparison: None.

CLINICAL DATA: Hypertension.

EXAM:
CHEST  2 VIEW

[w chest pa]
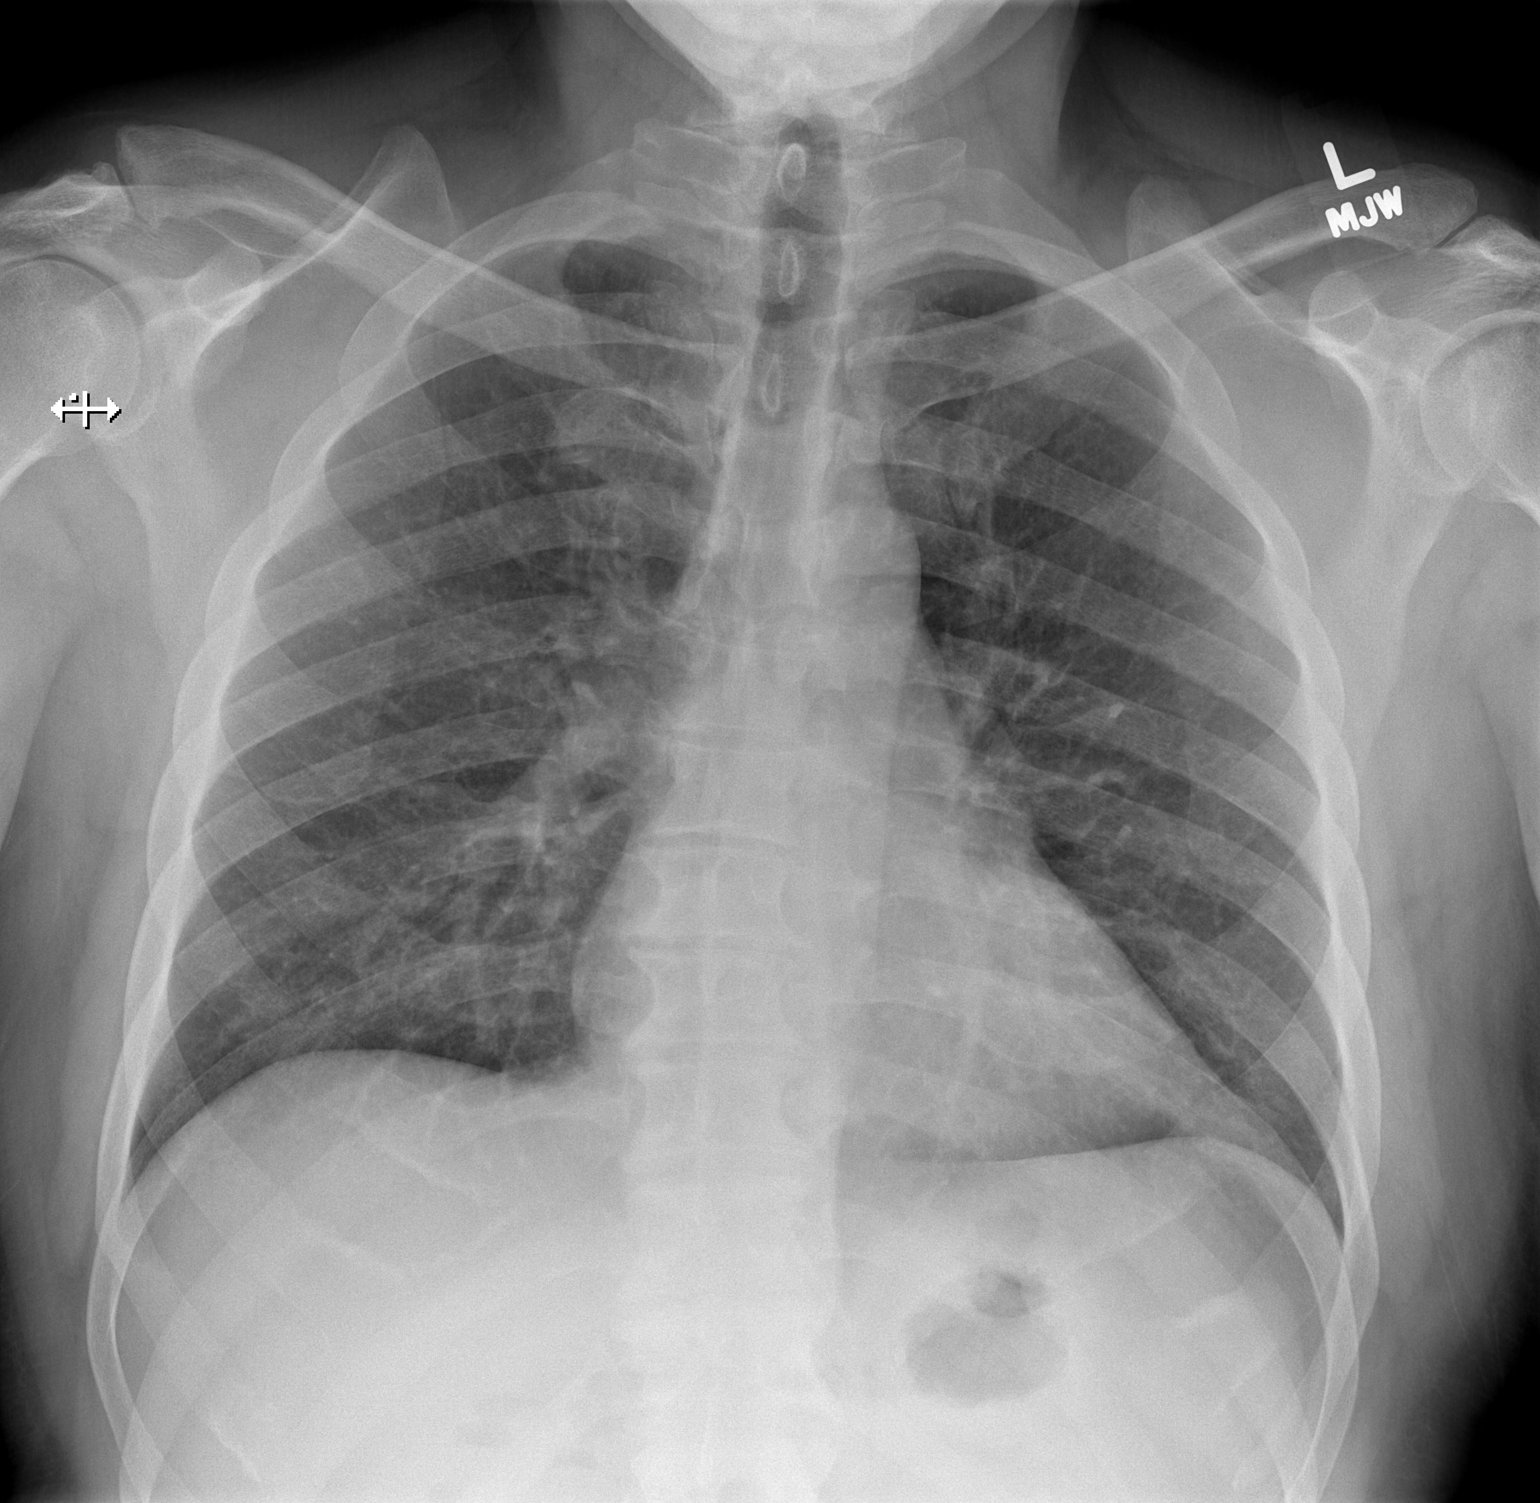

[w chest lat]
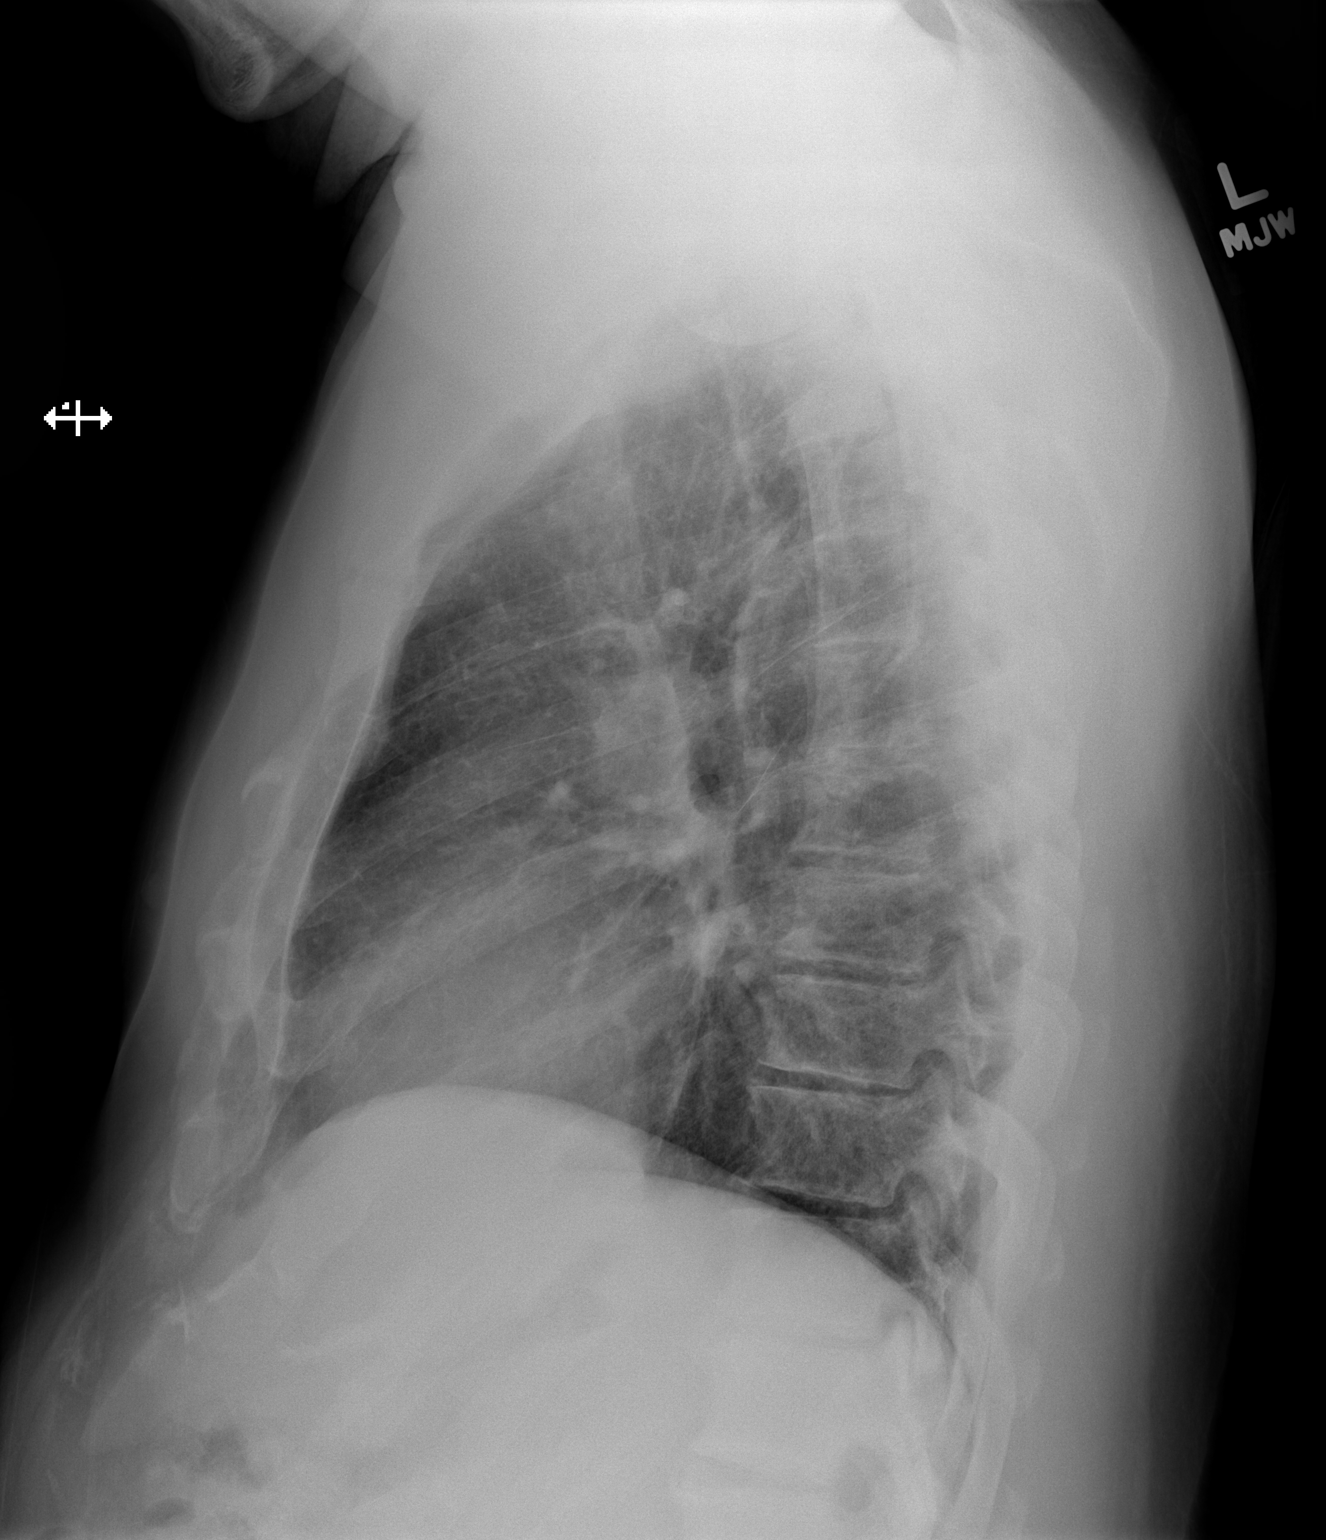

[2 of 2 positions shown; findings below may reference images not displayed]

FINDINGS: Mediastinum hilar structures normal. The lungs are clear. Heart size
normal. No pleural effusion pneumothorax.
IMPRESSION: No active cardiopulmonary disease.

## 2016-03-27 ENCOUNTER — Other Ambulatory Visit: Payer: Self-pay | Admitting: Internal Medicine

## 2016-03-27 DIAGNOSIS — M25512 Pain in left shoulder: Secondary | ICD-10-CM

## 2016-04-04 ENCOUNTER — Other Ambulatory Visit: Payer: Self-pay | Admitting: Internal Medicine

## 2016-04-04 DIAGNOSIS — M25512 Pain in left shoulder: Secondary | ICD-10-CM

## 2016-04-06 ENCOUNTER — Ambulatory Visit
Admission: RE | Admit: 2016-04-06 | Discharge: 2016-04-06 | Disposition: A | Discharge status: Home / Self Care | Payer: Medicaid Other | Source: Ambulatory Visit | Attending: Internal Medicine | Admitting: Internal Medicine

## 2016-04-06 ENCOUNTER — Ambulatory Visit: Payer: Medicaid Other

## 2016-04-06 DIAGNOSIS — R600 Localized edema: Secondary | ICD-10-CM | POA: Diagnosis not present

## 2016-04-06 DIAGNOSIS — S46812A Strain of other muscles, fascia and tendons at shoulder and upper arm level, left arm, initial encounter: Secondary | ICD-10-CM | POA: Insufficient documentation

## 2016-04-06 DIAGNOSIS — M7531 Calcific tendinitis of right shoulder: Secondary | ICD-10-CM | POA: Diagnosis not present

## 2016-04-06 DIAGNOSIS — X58XXXA Exposure to other specified factors, initial encounter: Secondary | ICD-10-CM | POA: Insufficient documentation

## 2016-04-06 DIAGNOSIS — M65812 Other synovitis and tenosynovitis, left shoulder: Secondary | ICD-10-CM | POA: Diagnosis not present

## 2016-04-06 DIAGNOSIS — M25512 Pain in left shoulder: Secondary | ICD-10-CM

## 2016-04-06 DIAGNOSIS — M12812 Other specific arthropathies, not elsewhere classified, left shoulder: Secondary | ICD-10-CM | POA: Diagnosis not present

## 2016-04-06 DIAGNOSIS — M25412 Effusion, left shoulder: Secondary | ICD-10-CM | POA: Insufficient documentation

## 2016-04-06 DIAGNOSIS — S46012A Strain of muscle(s) and tendon(s) of the rotator cuff of left shoulder, initial encounter: Secondary | ICD-10-CM | POA: Diagnosis not present

## 2016-04-24 DIAGNOSIS — M7582 Other shoulder lesions, left shoulder: Secondary | ICD-10-CM | POA: Insufficient documentation

## 2016-04-26 ENCOUNTER — Ambulatory Visit: Payer: Medicaid Other

## 2016-05-03 ENCOUNTER — Encounter
Admission: RE | Admit: 2016-05-03 | Discharge: 2016-05-03 | Disposition: A | Discharge status: Home / Self Care | Payer: Medicaid Other | Source: Ambulatory Visit | Attending: Surgery | Admitting: Surgery

## 2016-05-03 HISTORY — DX: Traumatic subdural hemorrhage with loss of consciousness of unspecified duration, initial encounter: S06.5X9A

## 2016-05-03 HISTORY — DX: Gastro-esophageal reflux disease without esophagitis: K21.9

## 2016-05-03 NOTE — Patient Instructions (Signed)
  Your procedure is scheduled on: 05-11-16 [facility]) Report to Same Day Surgery 2nd floor medical mall [facility] Entrance-take elevator on left to 2nd floor.  Check in with surgery information desk.) To find out your arrival time please call [phone] between 1PM - 3PM on 04-10-16 [facility])  Remember: Instructions that are not followed completely may result in serious medical risk, up to and including death, or upon the discretion of your surgeon and anesthesiologist your surgery may need to be rescheduled.    _x___ 1. Do not eat food or drink liquids after midnight. No gum chewing or hard candies.     __x__ 2. No Alcohol for 24 hours before or after surgery.   __x__3. No Smoking for 24 prior to surgery.   ____  4. Bring all medications with you on the day of surgery if instructed.    __x__ 5. Notify your doctor if there is any change in your medical condition     (cold, fever, infections).     Do not wear jewelry, make-up, hairpins, clips or nail polish.  Do not wear lotions, powders, or perfumes. You may wear deodorant.  Do not shave 48 hours prior to surgery. Men may shave face and neck.  Do not bring valuables to the hospital.    [facility] is not responsible for any belongings or valuables.               Contacts, dentures or bridgework may not be worn into surgery.  Leave your suitcase in the car. After surgery it may be brought to your room.  For patients admitted to the hospital, discharge time is determined by your treatment team.   Patients discharged the day of surgery will not be allowed to drive home.  You will need someone to drive you home and stay with you the night of your procedure.    Please read over the following fact sheets that you were given:   [facility] Preparing for Surgery and or MRSA Information   _x___ Take these medicines the morning of surgery with A SIP OF WATER:    1. ATORVASTATIN  2. AMLODIPINE-VALSARTAN  3. PANTOPRAZOLE  4. TAKE A  PANTOPRAZOLE ON Wednesday NIGHT BEFORE BED  5.  6.  ____Fleets enema or Magnesium Citrate as directed.   _x___ Use CHG Soap or sage wipes as directed on instruction sheet   ____ Use inhalers on the day of surgery and bring to hospital day of surgery  ____ Stop metformin 2 days prior to surgery    ____ Take 1/2 of usual insulin dose the night before surgery and none on the morning of surgery.   ____ Stop Aspirin, Coumadin, Pllavix ,Eliquis, Effient, or Pradaxa  x__ Stop Anti-inflammatories such as Advil, Aleve, Ibuprofen, Motrin, Naproxen,          Naprosyn, Goodies powders or aspirin products. Ok to take Tylenol OR PERCOCET IF NEEDED   ____ Stop supplements until after surgery.    ____ Bring C-Pap to the hospital.

## 2016-05-08 ENCOUNTER — Inpatient Hospital Stay: Admission: RE | Admit: 2016-05-08 | Payer: Medicaid Other | Source: Ambulatory Visit

## 2016-05-08 ENCOUNTER — Encounter
Admission: RE | Admit: 2016-05-08 | Discharge: 2016-05-08 | Disposition: A | Discharge status: Home / Self Care | Payer: Medicaid Other | Source: Ambulatory Visit | Attending: Surgery | Admitting: Surgery

## 2016-05-08 DIAGNOSIS — I10 Essential (primary) hypertension: Secondary | ICD-10-CM | POA: Insufficient documentation

## 2016-05-08 LAB — CBC
HEMATOCRIT: 43.4 % (ref 40.0–52.0)
HEMOGLOBIN: 14.6 g/dL (ref 13.0–18.0)
MCH: 29.5 pg (ref 26.0–34.0)
MCHC: 33.8 g/dL (ref 32.0–36.0)
MCV: 87.4 fL (ref 80.0–100.0)
PLATELETS: 181 10*3/uL (ref 150–440)
RBC: 4.96 MIL/uL (ref 4.40–5.90)
RDW: 14 % (ref 11.5–14.5)
WBC: 5.7 10*3/uL (ref 3.8–10.6)

## 2016-05-08 LAB — BASIC METABOLIC PANEL
ANION GAP: 9 (ref 5–15)
BUN: 21 mg/dL — ABNORMAL HIGH (ref 6–20)
CHLORIDE: 102 mmol/L (ref 101–111)
CO2: 24 mmol/L (ref 22–32)
Calcium: 9.4 mg/dL (ref 8.9–10.3)
Creatinine, Ser: 1.06 mg/dL (ref 0.61–1.24)
GFR calc non Af Amer: >60 mL/min (ref >60)
Glucose, Bld: 103 mg/dL — ABNORMAL HIGH (ref 65–99)
POTASSIUM: 3.7 mmol/L (ref 3.5–5.1)
SODIUM: 135 mmol/L (ref 135–145)

## 2016-05-10 ENCOUNTER — Encounter: Payer: Self-pay | Admitting: *Deleted

## 2016-05-11 ENCOUNTER — Ambulatory Visit: Payer: Medicaid Other | Admitting: Certified Registered Nurse Anesthetist

## 2016-05-11 ENCOUNTER — Ambulatory Visit
Admission: RE | Admit: 2016-05-11 | Discharge: 2016-05-11 | Disposition: A | Discharge status: Home / Self Care | Payer: Medicaid Other | Source: Ambulatory Visit | Attending: Surgery | Admitting: Surgery

## 2016-05-11 ENCOUNTER — Encounter
Admission: RE | Disposition: A | Discharge status: Home / Self Care | Payer: Self-pay | Source: Ambulatory Visit | Attending: Surgery

## 2016-05-11 DIAGNOSIS — M75122 Complete rotator cuff tear or rupture of left shoulder, not specified as traumatic: Secondary | ICD-10-CM | POA: Diagnosis not present

## 2016-05-11 DIAGNOSIS — I10 Essential (primary) hypertension: Secondary | ICD-10-CM | POA: Insufficient documentation

## 2016-05-11 DIAGNOSIS — M1990 Unspecified osteoarthritis, unspecified site: Secondary | ICD-10-CM | POA: Diagnosis not present

## 2016-05-11 DIAGNOSIS — G4730 Sleep apnea, unspecified: Secondary | ICD-10-CM | POA: Insufficient documentation

## 2016-05-11 DIAGNOSIS — M94212 Chondromalacia, left shoulder: Secondary | ICD-10-CM | POA: Diagnosis not present

## 2016-05-11 DIAGNOSIS — E785 Hyperlipidemia, unspecified: Secondary | ICD-10-CM | POA: Insufficient documentation

## 2016-05-11 DIAGNOSIS — M65812 Other synovitis and tenosynovitis, left shoulder: Secondary | ICD-10-CM | POA: Diagnosis not present

## 2016-05-11 DIAGNOSIS — M7542 Impingement syndrome of left shoulder: Secondary | ICD-10-CM | POA: Diagnosis not present

## 2016-05-11 DIAGNOSIS — Z880 Allergy status to penicillin: Secondary | ICD-10-CM | POA: Insufficient documentation

## 2016-05-11 DIAGNOSIS — Z96652 Presence of left artificial knee joint: Secondary | ICD-10-CM | POA: Diagnosis not present

## 2016-05-11 DIAGNOSIS — M069 Rheumatoid arthritis, unspecified: Secondary | ICD-10-CM | POA: Insufficient documentation

## 2016-05-11 DIAGNOSIS — M7522 Bicipital tendinitis, left shoulder: Secondary | ICD-10-CM | POA: Diagnosis not present

## 2016-05-11 DIAGNOSIS — K219 Gastro-esophageal reflux disease without esophagitis: Secondary | ICD-10-CM | POA: Diagnosis not present

## 2016-05-11 DIAGNOSIS — Z87891 Personal history of nicotine dependence: Secondary | ICD-10-CM | POA: Insufficient documentation

## 2016-05-11 HISTORY — PX: SHOULDER ARTHROSCOPY WITH DEBRIDEMENT AND BICEP TENDON REPAIR: SHX5690

## 2016-05-11 HISTORY — PX: SHOULDER ARTHROSCOPY WITH SUBACROMIAL DECOMPRESSION: SHX5684

## 2016-05-11 HISTORY — PX: SHOULDER ARTHROSCOPY WITH OPEN ROTATOR CUFF REPAIR: SHX6092

## 2016-05-11 LAB — URINE DRUG SCREEN, QUALITATIVE (ARMC ONLY)
AMPHETAMINES, UR SCREEN: NOT DETECTED
BARBITURATES, UR SCREEN: NOT DETECTED
BENZODIAZEPINE, UR SCRN: NOT DETECTED
CANNABINOID 50 NG, UR ~~LOC~~: POSITIVE — AB
Cocaine Metabolite,Ur ~~LOC~~: NOT DETECTED
MDMA (Ecstasy)Ur Screen: NOT DETECTED
Methadone Scn, Ur: NOT DETECTED
OPIATE, UR SCREEN: NOT DETECTED
PHENCYCLIDINE (PCP) UR S: NOT DETECTED
Tricyclic, Ur Screen: NOT DETECTED

## 2016-05-11 SURGERY — ARTHROSCOPY, SHOULDER WITH REPAIR, ROTATOR CUFF, OPEN
Anesthesia: Choice | Site: Shoulder | Laterality: Left | Wound class: Clean

## 2016-05-11 MED ORDER — CLINDAMYCIN PHOSPHATE 900 MG/50ML IV SOLN
900.0000 mg | Freq: Once | INTRAVENOUS | Status: AC
  Administered 2016-05-11: 900 mg via INTRAVENOUS

## 2016-05-11 MED ORDER — METOCLOPRAMIDE HCL 10 MG PO TABS
5.0000 mg | ORAL_TABLET | Freq: Three times a day (TID) | ORAL | Status: DC | PRN
Start: 2016-05-11 — End: 2016-05-11

## 2016-05-11 MED ORDER — ONDANSETRON HCL 4 MG PO TABS: 4.0000 mg | ORAL_TABLET | Freq: Four times a day (QID) | ORAL | Status: DC | PRN

## 2016-05-11 MED ORDER — LIDOCAINE HCL (CARDIAC) 20 MG/ML IV SOLN
INTRAVENOUS | Status: DC | PRN
  Administered 2016-05-11 (×2): 100 mg via INTRAVENOUS

## 2016-05-11 MED ORDER — LIDOCAINE HCL (PF) 1 % IJ SOLN
INTRAMUSCULAR | Status: AC
  Administered 2016-05-11: 2 mL via INTRAMUSCULAR
  Filled 2016-05-11: qty 5

## 2016-05-11 MED ORDER — MIDAZOLAM HCL 2 MG/2ML IJ SOLN
0.5000 mg | Freq: Once | INTRAMUSCULAR | Status: AC
  Administered 2016-05-11: 0.5 mg via INTRAVENOUS

## 2016-05-11 MED ORDER — BUPIVACAINE-EPINEPHRINE (PF) 0.25% -1:200000 IJ SOLN
INTRAMUSCULAR | Status: AC
  Filled 2016-05-11: qty 30

## 2016-05-11 MED ORDER — GLYCOPYRROLATE 0.2 MG/ML IJ SOLN
INTRAMUSCULAR | Status: DC | PRN
  Administered 2016-05-11: 0.4 mg via INTRAVENOUS

## 2016-05-11 MED ORDER — EPINEPHRINE PF 1 MG/ML IJ SOLN
INTRAMUSCULAR | Status: AC
  Administered 2016-05-11: 1 mg via INTRAMUSCULAR
  Filled 2016-05-11: qty 1

## 2016-05-11 MED ORDER — MIDAZOLAM HCL 2 MG/2ML IJ SOLN
INTRAMUSCULAR | Status: AC
  Administered 2016-05-11: 1.5 mg via INTRAVENOUS
  Filled 2016-05-11: qty 2

## 2016-05-11 MED ORDER — FENTANYL CITRATE (PF) 100 MCG/2ML IJ SOLN
INTRAMUSCULAR | Status: DC | PRN
  Administered 2016-05-11: 250 ug via INTRAVENOUS

## 2016-05-11 MED ORDER — HYDROCODONE-ACETAMINOPHEN 5-325 MG PO TABS: 1.0000 | ORAL_TABLET | ORAL | 0 refills | Status: DC | PRN

## 2016-05-11 MED ORDER — LIDOCAINE HCL (PF) 4 % IJ SOLN
INTRAMUSCULAR | Status: DC | PRN
  Administered 2016-05-11: 4 mL via RESPIRATORY_TRACT

## 2016-05-11 MED ORDER — ONDANSETRON HCL 4 MG/2ML IJ SOLN
INTRAMUSCULAR | Status: DC | PRN
  Administered 2016-05-11: 4 mg via INTRAVENOUS

## 2016-05-11 MED ORDER — SUCCINYLCHOLINE CHLORIDE 20 MG/ML IJ SOLN
INTRAMUSCULAR | Status: DC | PRN
  Administered 2016-05-11: 100 mg via INTRAVENOUS

## 2016-05-11 MED ORDER — MIDAZOLAM HCL 2 MG/2ML IJ SOLN
1.5000 mg | Freq: Once | INTRAMUSCULAR | Status: AC
Start: 2016-05-11 — End: 2016-05-11
  Administered 2016-05-11: 1.5 mg via INTRAVENOUS

## 2016-05-11 MED ORDER — SUGAMMADEX SODIUM 200 MG/2ML IV SOLN
INTRAVENOUS | Status: DC | PRN
  Administered 2016-05-11: 250 mg via INTRAVENOUS

## 2016-05-11 MED ORDER — LACTATED RINGERS IV SOLN
INTRAVENOUS | Status: DC
  Administered 2016-05-11: 09:00:00 via INTRAVENOUS
  Administered 2016-05-11: 1000 mL via INTRAVENOUS

## 2016-05-11 MED ORDER — ACETAMINOPHEN 10 MG/ML IV SOLN
INTRAVENOUS | Status: AC
  Filled 2016-05-11: qty 100

## 2016-05-11 MED ORDER — FENTANYL CITRATE (PF) 100 MCG/2ML IJ SOLN: 25.0000 ug | INTRAMUSCULAR | Status: DC | PRN

## 2016-05-11 MED ORDER — HYDROMORPHONE HCL 1 MG/ML IJ SOLN
INTRAMUSCULAR | Status: DC | PRN
  Administered 2016-05-11: 1 mg via INTRAVENOUS

## 2016-05-11 MED ORDER — EPINEPHRINE PF 1 MG/ML IJ SOLN
INTRAMUSCULAR | Status: DC | PRN
  Administered 2016-05-11: 2 mL

## 2016-05-11 MED ORDER — ROCURONIUM BROMIDE 100 MG/10ML IV SOLN
INTRAVENOUS | Status: DC | PRN
  Administered 2016-05-11: 20 mg via INTRAVENOUS
  Administered 2016-05-11: 5 mg via INTRAVENOUS
  Administered 2016-05-11 (×2): 10 mg via INTRAVENOUS
  Administered 2016-05-11: 45 mg via INTRAVENOUS

## 2016-05-11 MED ORDER — EPINEPHRINE 30 MG/30ML IJ SOLN
INTRAMUSCULAR | Status: AC
  Filled 2016-05-11: qty 2

## 2016-05-11 MED ORDER — EPINEPHRINE PF 1 MG/ML IJ SOLN
INTRAMUSCULAR | Status: AC
  Filled 2016-05-11: qty 2

## 2016-05-11 MED ORDER — METOCLOPRAMIDE HCL 5 MG/ML IJ SOLN: 5.0000 mg | Freq: Three times a day (TID) | INTRAMUSCULAR | Status: DC | PRN

## 2016-05-11 MED ORDER — PHENYLEPHRINE HCL 10 MG/ML IJ SOLN
INTRAVENOUS | Status: DC | PRN
  Administered 2016-05-11: 50 ug/min via INTRAVENOUS
  Administered 2016-05-11: 30 ug/min via INTRAVENOUS

## 2016-05-11 MED ORDER — DEXAMETHASONE SODIUM PHOSPHATE 4 MG/ML IJ SOLN
INTRAMUSCULAR | Status: DC | PRN
  Administered 2016-05-11: 8 mg via INTRAVENOUS

## 2016-05-11 MED ORDER — ROPIVACAINE HCL 5 MG/ML IJ SOLN
INTRAMUSCULAR | Status: AC
  Administered 2016-05-11: 30 mL via INTRAMUSCULAR
  Filled 2016-05-11: qty 40

## 2016-05-11 MED ORDER — ONDANSETRON HCL 4 MG/2ML IJ SOLN: 4.0000 mg | Freq: Four times a day (QID) | INTRAMUSCULAR | Status: DC | PRN

## 2016-05-11 MED ORDER — CLINDAMYCIN PHOSPHATE 900 MG/50ML IV SOLN
INTRAVENOUS | Status: AC
  Filled 2016-05-11: qty 50

## 2016-05-11 MED ORDER — PROPOFOL 10 MG/ML IV BOLUS
INTRAVENOUS | Status: DC | PRN
  Administered 2016-05-11: 200 mg via INTRAVENOUS

## 2016-05-11 MED ORDER — HYDROCODONE-ACETAMINOPHEN 5-325 MG PO TABS: 1.0000 | ORAL_TABLET | ORAL | Status: DC | PRN

## 2016-05-11 MED ORDER — ONDANSETRON HCL 4 MG/2ML IJ SOLN: 4.0000 mg | Freq: Once | INTRAMUSCULAR | Status: DC | PRN

## 2016-05-11 MED ORDER — BUPIVACAINE-EPINEPHRINE (PF) 0.25% -1:200000 IJ SOLN
INTRAMUSCULAR | Status: DC | PRN
  Administered 2016-05-11: 30 mL via PERINEURAL

## 2016-05-11 MED ORDER — VASOPRESSIN 20 UNIT/ML IV SOLN
INTRAVENOUS | Status: DC | PRN
  Administered 2016-05-11 (×3): 2 [IU] via INTRAVENOUS

## 2016-05-11 MED ORDER — ACETAMINOPHEN 10 MG/ML IV SOLN
INTRAVENOUS | Status: DC | PRN
  Administered 2016-05-11: 1000 mg via INTRAVENOUS

## 2016-05-11 SURGICAL SUPPLY — 49 items
ANCHOR JUGGERKNOT WTAP NDL 2.9 (Anchor) ×9 IMPLANT
ANCHOR SUT QUATTRO KNTLS 4.5 (Anchor) ×9 IMPLANT
ANCHOR SUT W/ ORTHOCORD (Anchor) IMPLANT
BIT DRILL JUGRKNT W/NDL BIT2.9 (DRILL) ×1 IMPLANT
BLADE FULL RADIUS 3.5 (BLADE) ×3 IMPLANT
BLADE SHAVER 4.5X7 STR FR (MISCELLANEOUS) IMPLANT
BUR ACROMIONIZER 4.0 (BURR) ×3 IMPLANT
BUR BR 5.5 WIDE MOUTH (BURR) IMPLANT
CANNULA SHAVER 8MMX76MM (CANNULA) ×6 IMPLANT
CHLORAPREP W/TINT 26ML (MISCELLANEOUS) ×3 IMPLANT
COVER MAYO STAND STRL (DRAPES) ×3 IMPLANT
DRAPE IMP U-DRAPE 54X76 (DRAPES) ×6 IMPLANT
DRILL JUGGERKNOT W/NDL BIT 2.9 (DRILL) ×3
DRSG OPSITE POSTOP 4X8 (GAUZE/BANDAGES/DRESSINGS) IMPLANT
ELECT REM PT RETURN 9FT ADLT (ELECTROSURGICAL) ×3
ELECTRODE REM PT RTRN 9FT ADLT (ELECTROSURGICAL) ×1 IMPLANT
GAUZE PETRO XEROFOAM 1X8 (MISCELLANEOUS) ×3 IMPLANT
GAUZE SPONGE 4X4 12PLY STRL (GAUZE/BANDAGES/DRESSINGS) ×3 IMPLANT
GAUZE XEROFORM 4X4 STRL (GAUZE/BANDAGES/DRESSINGS) ×3 IMPLANT
GLOVE BIO SURGEON STRL SZ7.5 (GLOVE) ×6 IMPLANT
GLOVE BIO SURGEON STRL SZ8 (GLOVE) ×6 IMPLANT
GLOVE BIOGEL PI IND STRL 8 (GLOVE) ×1 IMPLANT
GLOVE BIOGEL PI INDICATOR 8 (GLOVE) ×2
GLOVE INDICATOR 8.0 STRL GRN (GLOVE) ×3 IMPLANT
GOWN STRL REUS W/ TWL LRG LVL3 (GOWN DISPOSABLE) ×2 IMPLANT
GOWN STRL REUS W/ TWL XL LVL3 (GOWN DISPOSABLE) ×1 IMPLANT
GOWN STRL REUS W/TWL LRG LVL3 (GOWN DISPOSABLE) ×4
GOWN STRL REUS W/TWL XL LVL3 (GOWN DISPOSABLE) ×2
GRASPER SUT 15 45D LOW PRO (SUTURE) ×3 IMPLANT
IV LACTATED RINGER IRRG 3000ML (IV SOLUTION) ×4
IV LR IRRIG 3000ML ARTHROMATIC (IV SOLUTION) ×2 IMPLANT
MANIFOLD NEPTUNE II (INSTRUMENTS) ×3 IMPLANT
MASK FACE SPIDER DISP (MASK) ×3 IMPLANT
MAT BLUE FLOOR 46X72 FLO (MISCELLANEOUS) ×3 IMPLANT
NEEDLE REVERSE CUT 1/2 CRC (NEEDLE) IMPLANT
PACK ARTHROSCOPY SHOULDER (MISCELLANEOUS) ×3 IMPLANT
PAD ABD DERMACEA PRESS 5X9 (GAUZE/BANDAGES/DRESSINGS) ×3 IMPLANT
SLING ARM LRG DEEP (SOFTGOODS) IMPLANT
SLING ULTRA II LG (MISCELLANEOUS) ×3 IMPLANT
STAPLER SKIN PROX 35W (STAPLE) ×3 IMPLANT
STRAP SAFETY BODY (MISCELLANEOUS) ×3 IMPLANT
SUT ETHIBOND 0 MO6 C/R (SUTURE) ×3 IMPLANT
SUT VIC AB 2-0 CT1 27 (SUTURE) ×4
SUT VIC AB 2-0 CT1 TAPERPNT 27 (SUTURE) ×2 IMPLANT
TAPE MICROFOAM 4IN (TAPE) ×3 IMPLANT
TUBING ARTHRO INFLOW-ONLY STRL (TUBING) ×3 IMPLANT
TUBING CONNECTING 10 (TUBING) ×2 IMPLANT
TUBING CONNECTING 10' (TUBING) ×1
WAND HAND CNTRL MULTIVAC 90 (MISCELLANEOUS) ×3 IMPLANT

## 2016-05-11 NOTE — Op Note (Signed)
05/11/2016  9:57 AM  Patient:   Gabriel Russell  Pre-Op Diagnosis:   Impingement/tendinopathy with full-thickness rotator cuff tear and biceps tendinopathy, left shoulder.  Postoperative diagnosis: Impingement/tendinopathy with full-thickness rotator cuff tear and biceps tendinopathy, left shoulder.  Procedure: Limited arthroscopic debridement, arthroscopic subacromial decompression, mini-open rotator cuff repair, and mini-open biceps tenodesis, left shoulder.  Anesthesia: General endotracheal with interscalene block placed preoperatively by the anesthesiologist.  Surgeon:   Maryagnes Amos, MD  Assistant:   Horris Latino, PA-C  Findings: As above. There was minor fraying of the labrum anteriorly and superiorly without frank detachment from the glenoid. There were focal grade 2 chondromalacial changes involving the central glenoid but the humeral articular surface was in excellent condition. There was a full-thickness tear involving the entire supraspinatus tendon insertion, as were bilateral as a partial-thickness tear involving the superior portion of the subscapularis tendon. Mild-moderate tendinopathic changes of the biceps tendon were noted as well.  Complications: None  Fluids:   1300 cc  Estimated blood loss: 20 cc  Tourniquet time: None  Drains: None  Closure: Staples   Brief clinical note: The patient is a 60 year old male with a history of left shoulder pain. The patient's symptoms have progressed despite medications, activity modification, etc. The patient's history and examination are consistent with impingement/tendinopathy with a rotator cuff tear. These findings were confirmed by MRI scan. The patient presents at this time for definitive management of these shoulder symptoms.  Procedure: The patient underwent placement of an interscalene block by the anesthesiologist in the preoperative holding area before he was brought into the operating room  and lain in the supine position. The patient then underwent general endotracheal intubation and anesthesia before being repositioned in the beach chair position using the beach chair positioner. The left shoulder and upper extremity were prepped with ChloraPrep solution before being draped sterilely. Preoperative antibiotics were administered. A timeout was performed to confirm the proper surgical site before the expected portal sites and incision site were injected with 0.25% Sensorcaine with epinephrine. A posterior portal was created and the glenohumeral joint thoroughly inspected with the findings as described above. An anterior portal was created using an outside-in technique. The labrum and rotator cuff were further probed, again confirming the above-noted findings. Areas of extensive synovitis anteriorly, superiorly, and posterosuperiorly were debrided back to stable margins using the full-radius resector. Consideration was made to perform an arthroscopic repair of the superior portion of the subscapularis tendon. However, the angle did not permit proper orientation of the anchor so it was elected to proceed with this repair during the mini open portion of the procedure. Therefore, a single Mitek BioKnotless anchor was wasted. The ArthroCare wand was inserted and used to obtain hemostasis as well as to "anneal" the labrum superiorly and anteriorly. The instruments were removed from the joint after suctioning the excess fluid.  The camera was repositioned through the posterior portal into the subacromial space. A separate lateral portal was created using an outside-in technique. The 3.5 mm full-radius resector was introduced and used to perform a subtotal bursectomy. The ArthroCare wand was then inserted and used to remove the periosteal tissue off the undersurface of the anterior third of the acromion as well as to recess the coracoacromial ligament from its attachment along the anterior and lateral margins  of the acromion. The 4.0 mm acromionizing bur was introduced and used to complete the decompression by removing the undersurface of the anterior third of the acromion. The full radius resector was reintroduced  to remove any residual bony debris before the ArthroCare wand was reintroduced to obtain hemostasis. The instruments were then removed from the subacromial space after suctioning the excess fluid.  An approximately 4-5 cm incision was made over the anterolateral aspect of the shoulder beginning at the anterolateral corner of the acromion and extending distally in line with the bicipital groove. This incision was carried down through the subcutaneous tissues to expose the deltoid fascia. The raphae between the anterior and middle thirds was identified and this plane developed to provide access into the subacromial space. Additional bursal tissues were debrided sharply using Metzenbaum scissors. The rotator cuff tear was readily identified. The margins were debrided sharply with a #15 blade and the exposed greater tuberosity roughened with a rongeur. The tear was repaired using several #0 Ethibond interrupted sutures placed in a side-to-side fashion to repair the longitudinal portion of the tear. Laterally, the tear was repaired using two Biomet 2.9 mm JuggerKnot anchors. These sutures were then brought back laterally and secured using two [place] QuatroLink anchors to create a two-layer closure. An apparent watertight closure was obtained.  The bicipital groove was identified by palpation and opened for 1-1.5 cm. The biceps tendon stump was retrieved through this defect. The floor of the bicipital groove was roughened with a curet before another Biomet 2.9 mm JuggerKnot anchor was inserted. Both sets of sutures were passed through the biceps tendon and tied securely to effect the tenodesis. The bicipital sheath was reapproximated using two #0 Ethibond interrupted sutures, incorporating the biceps tendon to  further reinforce the tenodesis.  The wound was copiously irrigated with sterile saline solution before the deltoid raphae was reapproximated using 2-0 Vicryl interrupted sutures. The subcutaneous tissues were closed in two layers using 2-0 Vicryl interrupted sutures before the skin was closed using staples. The portal sites also were closed using staples. A sterile bulky dressing was applied to the shoulder before the arm was placed into a shoulder immobilizer. The patient was then awakened, extubated, and returned to the recovery room in satisfactory condition after tolerating the procedure well.

## 2016-05-11 NOTE — Anesthesia Postprocedure Evaluation (Signed)
Anesthesia Post Note  Patient: Jeanella Craze  Procedure(s) Performed: Procedure(s) (LRB): SHOULDER ARTHROSCOPY WITH OPEN ROTATOR CUFF REPAIR (Left) SHOULDER ARTHROSCOPY WITH SUBACROMIAL DECOMPRESSION (Left) SHOULDER ARTHROSCOPY WITH DEBRIDEMENT AND BICEP TENDON REPAIR (Left)  Patient location during evaluation: PACU Anesthesia Type: General Level of consciousness: awake and alert Pain management: pain level controlled Vital Signs Assessment: post-procedure vital signs reviewed and stable Respiratory status: spontaneous breathing, nonlabored ventilation, respiratory function stable and patient connected to nasal cannula oxygen Cardiovascular status: blood pressure returned to baseline and stable Postop Assessment: no signs of nausea or vomiting Anesthetic complications: no    Last Vitals:  Vitals:   05/11/16 0730 05/11/16 1010  BP: 123/89 (!) 101/57  Pulse: 85 80  Resp: 18   Temp:  36.2 C    Last Pain:  Vitals:   05/11/16 0618  TempSrc: Oral  PainSc: 2                  Yevette Edwards

## 2016-05-11 NOTE — Anesthesia Procedure Notes (Signed)
Procedure Name: Intubation Date/Time: 05/11/2016 7:45 AM Performed by: Rosaria Ferries, Karimah Winquist Pre-anesthesia Checklist: Patient identified, Emergency Drugs available, Suction available and Patient being monitored Patient Re-evaluated:Patient Re-evaluated prior to inductionOxygen Delivery Method: Circle system utilized Preoxygenation: Pre-oxygenation with 100% oxygen Intubation Type: IV induction Laryngoscope Size: Mac and 3 Grade View: Grade I Tube type: Oral Tube size: 7.0 mm Number of attempts: 1 Airway Equipment and Method: LTA kit utilized (thyroid pressure rightward and downward) Placement Confirmation: ETT inserted through vocal cords under direct vision,  positive ETCO2 and breath sounds checked- equal and bilateral Secured at: 23.5 cm Tube secured with: Tape Dental Injury: Teeth and Oropharynx as per pre-operative assessment

## 2016-05-11 NOTE — H&P (Signed)
Paper H&P to be scanned into permanent record. H&P reviewed. No changes. 

## 2016-05-11 NOTE — Anesthesia Preprocedure Evaluation (Signed)
Anesthesia Evaluation  Patient identified by MRN, date of birth, ID band Patient awake    Reviewed: Allergy & Precautions, H&P , NPO status , Patient's Chart, lab work & pertinent test results, reviewed documented beta blocker date and time   Airway Mallampati: III  TM Distance: >3 FB Neck ROM: full    Dental  (+) Teeth Intact, Poor Dentition   Pulmonary neg pulmonary ROS, sleep apnea , former smoker,    Pulmonary exam normal        Cardiovascular hypertension, negative cardio ROS Normal cardiovascular exam Rhythm:regular Rate:Normal     Neuro/Psych negative neurological ROS  negative psych ROS   GI/Hepatic negative GI ROS, Neg liver ROS, GERD  Medicated,  Endo/Other  negative endocrine ROS  Renal/GU negative Renal ROS  negative genitourinary   Musculoskeletal   Abdominal   Peds  Hematology negative hematology ROS (+)   Anesthesia Other Findings Past Medical History: No date: Arthritis No date: Concussion No date: GERD (gastroesophageal reflux disease)     Comment: OCC No date: Hypertension No date: Sleep apnea     Comment: cpap     >3 yrs 2014: Subdural hematoma (HCC)     Comment: AFTER A FALL ON ICE Past Surgical History: 09/04/2013: ANTERIOR CERVICAL DECOMPRESSION/DISCECTOMY FUS* N/A     Comment: Procedure: ANTERIOR CERVICAL               DECOMPRESSION/DISCECTOMY FUSION CERVICAL               THREE-FOUR, CERVICAL FOUR-FIVE, CERVICAL               FIVE-SIX, CERVICALSIX-SEVEN ;  Surgeon: Reinaldo Meeker, MD;  Location: [facility];  Service:              Neurosurgery;  Laterality: N/A; No date: BRAIN SURGERY     Comment: SUBDURAL HEMATOMA No date: JOINT REPLACEMENT Left     Comment: KNEE BMI    Body Mass Index:  30.43 kg/m     Reproductive/Obstetrics negative OB ROS                             Anesthesia Physical Anesthesia Plan  ASA: II  Anesthesia Plan:  General ETT   Post-op Pain Management:  Regional for Post-op pain   Induction:   Airway Management Planned:   Additional Equipment:   Intra-op Plan:   Post-operative Plan:   Informed Consent: I have reviewed the patients History and Physical, chart, labs and discussed the procedure including the risks, benefits and alternatives for the proposed anesthesia with the patient or authorized representative who has indicated his/her understanding and acceptance.   Dental Advisory Given  Plan Discussed with: CRNA  Anesthesia Plan Comments:         Anesthesia Quick Evaluation

## 2016-05-11 NOTE — Discharge Instructions (Addendum)
Keep dressing dry and intact.  May shower after dressing changed on post-op day #4 (Monday).  Cover staples with Band-Aids after drying off. Apply ice frequently to shoulder. Take ibuprofen 800 mg TID with meals for 7-10 days, then as necessary. Take oxycodone as prescribed when needed.  May supplement with ES Tylenol if necessary. Keep shoulder immobilizer on at all times except may remove for bathing purposes. Follow-up in 10-14 days or as scheduled.  AMBULATORY SURGERY  DISCHARGE INSTRUCTIONS   1) The drugs that you were given will stay in your system until tomorrow so for the next 24 hours you should not:  A) Drive an automobile B) Make any legal decisions C) Drink any alcoholic beverage   2) You may resume regular meals tomorrow.  Today it is better to start with liquids and gradually work up to solid foods.  You may eat anything you prefer, but it is better to start with liquids, then soup and crackers, and gradually work up to solid foods.   3) Please notify your doctor immediately if you have any unusual bleeding, trouble breathing, redness and pain at the surgery site, drainage, fever, or pain not relieved by medication.    4) Additional Instructions: TAKE A STOOL SOFTENER TWICE A DAY WHILE TAKING NARCOTIC PAIN MEDICINE TO PREVENT CONSTIPATION   Please contact your physician with any problems or Same Day Surgery at [phone], Monday through Friday 6 am to 4 pm, or [place] [facility] number at [phone].

## 2016-05-11 NOTE — Anesthesia Procedure Notes (Signed)
Anesthesia Regional Block:  Interscalene brachial plexus block  Pre-Anesthetic Checklist: ,, timeout performed, Correct Patient, Correct Site, Correct Laterality, Correct Procedure, Correct Position, site marked, Risks and benefits discussed,  Surgical consent,  Pre-op evaluation,  At surgeon's request and post-op pain management  Laterality: Left  Prep: chloraprep       Needles:  Injection technique: Single-shot  Needle Type: Echogenic Stimulator Needle     Needle Length: 10cm 10 cm Needle Gauge: 22 and 22 G    Additional Needles:  Procedures: ultrasound guided (picture in chart) Interscalene brachial plexus block Narrative:   Additional Notes: Tolerated well and without pain upon injection.  No iv sx and vsst. Gabriel Russell

## 2016-05-11 NOTE — Transfer of Care (Signed)
Immediate Anesthesia Transfer of Care Note  Patient: Gabriel Russell  Procedure(s) Performed: Procedure(s): SHOULDER ARTHROSCOPY WITH OPEN ROTATOR CUFF REPAIR (Left) SHOULDER ARTHROSCOPY WITH SUBACROMIAL DECOMPRESSION (Left) SHOULDER ARTHROSCOPY WITH DEBRIDEMENT AND BICEP TENDON REPAIR (Left)  Patient Location: PACU  Anesthesia Type:General  Level of Consciousness: awake, alert , oriented and patient cooperative  Airway & Oxygen Therapy: Patient Spontanous Breathing and Patient connected to nasal cannula oxygen  Post-op Assessment: Report given to RN and Post -op Vital signs reviewed and stable  Post vital signs: Reviewed and stable  Last Vitals:  Vitals:   05/11/16 0730 05/11/16 1010  BP: 123/89 (!) 101/57  Pulse: 85 80  Resp: 18   Temp:  36.2 C    Last Pain:  Vitals:   05/11/16 0618  TempSrc: Oral  PainSc: 2       Patients Stated Pain Goal: 2 (05/11/16 0618)  Complications: No apparent anesthesia complications

## 2016-05-15 DIAGNOSIS — M7522 Bicipital tendinitis, left shoulder: Secondary | ICD-10-CM | POA: Insufficient documentation

## 2016-11-28 ENCOUNTER — Encounter: Payer: Self-pay | Admitting: Emergency Medicine

## 2016-11-28 DIAGNOSIS — Z5321 Procedure and treatment not carried out due to patient leaving prior to being seen by health care provider: Secondary | ICD-10-CM | POA: Insufficient documentation

## 2016-11-28 DIAGNOSIS — R42 Dizziness and giddiness: Secondary | ICD-10-CM | POA: Diagnosis not present

## 2016-11-28 LAB — URINALYSIS, COMPLETE (UACMP) WITH MICROSCOPIC
Bacteria, UA: NONE SEEN
Bilirubin Urine: NEGATIVE
Glucose, UA: NEGATIVE mg/dL
KETONES UR: NEGATIVE mg/dL
Nitrite: NEGATIVE
PROTEIN: NEGATIVE mg/dL
Specific Gravity, Urine: 1.01 (ref 1.005–1.030)
pH: 8 (ref 5.0–8.0)

## 2016-11-28 LAB — COMPREHENSIVE METABOLIC PANEL
ALT: 19 U/L (ref 17–63)
ANION GAP: 6 (ref 5–15)
AST: 19 U/L (ref 15–41)
Albumin: 3.8 g/dL (ref 3.5–5.0)
Alkaline Phosphatase: 98 U/L (ref 38–126)
BUN: 14 mg/dL (ref 6–20)
CHLORIDE: 100 mmol/L — AB (ref 101–111)
CO2: 25 mmol/L (ref 22–32)
Calcium: 8.7 mg/dL — ABNORMAL LOW (ref 8.9–10.3)
Creatinine, Ser: 1.31 mg/dL — ABNORMAL HIGH (ref 0.61–1.24)
GFR calc Af Amer: >60 mL/min (ref >60)
GFR, EST NON AFRICAN AMERICAN: 57 mL/min — AB (ref >60)
Glucose, Bld: 131 mg/dL — ABNORMAL HIGH (ref 65–99)
POTASSIUM: 3.7 mmol/L (ref 3.5–5.1)
Sodium: 131 mmol/L — ABNORMAL LOW (ref 135–145)
TOTAL PROTEIN: 7.5 g/dL (ref 6.5–8.1)
Total Bilirubin: 0.5 mg/dL (ref 0.3–1.2)

## 2016-11-28 LAB — CBC
HCT: 41.1 % (ref 40.0–52.0)
HEMOGLOBIN: 14.5 g/dL (ref 13.0–18.0)
MCH: 31.8 pg (ref 26.0–34.0)
MCHC: 35.4 g/dL (ref 32.0–36.0)
MCV: 90 fL (ref 80.0–100.0)
Platelets: 155 10*3/uL (ref 150–440)
RBC: 4.57 MIL/uL (ref 4.40–5.90)
RDW: 14.5 % (ref 11.5–14.5)
WBC: 5.8 10*3/uL (ref 3.8–10.6)

## 2016-11-28 LAB — TROPONIN I

## 2016-11-28 NOTE — ED Triage Notes (Signed)
Patient ambulatory to triage with steady gait, without difficulty or distress noted; pt reports dizziness x 5 days; st he was concerned because his BP was 102/68 yesterday

## 2016-11-29 ENCOUNTER — Emergency Department
Admission: EM | Admit: 2016-11-29 | Discharge: 2016-11-29 | Disposition: A | Discharge status: Home / Self Care | Payer: Medicaid Other | Attending: Emergency Medicine | Admitting: Emergency Medicine

## 2016-11-29 NOTE — ED Notes (Signed)
Pt states "I told the nurse I was a walker and since my blood pressure is ok I'm leaving and will see by pcp in the morning"

## 2016-11-30 ENCOUNTER — Telehealth: Payer: Self-pay | Admitting: Emergency Medicine

## 2016-11-30 NOTE — Telephone Encounter (Signed)
Called patient due to lwot to inquire about condition and follow up plans. Patient says he feels better.  I advised him to call pcp and ask them to review his labs as they are slightly off --electrolytes, creatinine.  He agrees to call.

## 2017-07-17 ENCOUNTER — Encounter: Payer: Self-pay | Admitting: Nurse Practitioner

## 2017-07-17 ENCOUNTER — Ambulatory Visit
Admission: RE | Admit: 2017-07-17 | Discharge: 2017-07-17 | Disposition: A | Discharge status: Home / Self Care | Payer: Medicaid Other | Source: Ambulatory Visit | Attending: Nurse Practitioner | Admitting: Nurse Practitioner

## 2017-07-17 ENCOUNTER — Telehealth: Payer: Self-pay | Admitting: Nurse Practitioner

## 2017-07-17 ENCOUNTER — Other Ambulatory Visit: Payer: Self-pay

## 2017-07-17 ENCOUNTER — Ambulatory Visit: Payer: Medicaid Other | Attending: Nurse Practitioner | Admitting: Nurse Practitioner

## 2017-07-17 VITALS — BP 127/82 | HR 75 | Temp 98.0°F | Resp 16 | Ht 76.0 in | Wt 255.0 lb

## 2017-07-17 DIAGNOSIS — I10 Essential (primary) hypertension: Secondary | ICD-10-CM | POA: Insufficient documentation

## 2017-07-17 DIAGNOSIS — K219 Gastro-esophageal reflux disease without esophagitis: Secondary | ICD-10-CM | POA: Insufficient documentation

## 2017-07-17 DIAGNOSIS — Z789 Other specified health status: Secondary | ICD-10-CM | POA: Diagnosis not present

## 2017-07-17 DIAGNOSIS — M533 Sacrococcygeal disorders, not elsewhere classified: Secondary | ICD-10-CM | POA: Diagnosis not present

## 2017-07-17 DIAGNOSIS — G8929 Other chronic pain: Secondary | ICD-10-CM

## 2017-07-17 DIAGNOSIS — M1990 Unspecified osteoarthritis, unspecified site: Secondary | ICD-10-CM | POA: Insufficient documentation

## 2017-07-17 DIAGNOSIS — F17210 Nicotine dependence, cigarettes, uncomplicated: Secondary | ICD-10-CM | POA: Diagnosis not present

## 2017-07-17 DIAGNOSIS — G9589 Other specified diseases of spinal cord: Secondary | ICD-10-CM | POA: Diagnosis not present

## 2017-07-17 DIAGNOSIS — M542 Cervicalgia: Secondary | ICD-10-CM | POA: Diagnosis present

## 2017-07-17 DIAGNOSIS — G4730 Sleep apnea, unspecified: Secondary | ICD-10-CM | POA: Insufficient documentation

## 2017-07-17 DIAGNOSIS — M25562 Pain in left knee: Secondary | ICD-10-CM | POA: Diagnosis not present

## 2017-07-17 DIAGNOSIS — M5136 Other intervertebral disc degeneration, lumbar region: Secondary | ICD-10-CM | POA: Diagnosis not present

## 2017-07-17 DIAGNOSIS — M50223 Other cervical disc displacement at C6-C7 level: Secondary | ICD-10-CM | POA: Diagnosis not present

## 2017-07-17 DIAGNOSIS — Z96652 Presence of left artificial knee joint: Secondary | ICD-10-CM | POA: Diagnosis not present

## 2017-07-17 DIAGNOSIS — M1711 Unilateral primary osteoarthritis, right knee: Secondary | ICD-10-CM | POA: Diagnosis not present

## 2017-07-17 DIAGNOSIS — M545 Low back pain, unspecified: Secondary | ICD-10-CM

## 2017-07-17 DIAGNOSIS — M25561 Pain in right knee: Secondary | ICD-10-CM | POA: Insufficient documentation

## 2017-07-17 DIAGNOSIS — M4802 Spinal stenosis, cervical region: Secondary | ICD-10-CM | POA: Insufficient documentation

## 2017-07-17 DIAGNOSIS — M47812 Spondylosis without myelopathy or radiculopathy, cervical region: Secondary | ICD-10-CM | POA: Diagnosis not present

## 2017-07-17 DIAGNOSIS — Z981 Arthrodesis status: Secondary | ICD-10-CM | POA: Insufficient documentation

## 2017-07-17 DIAGNOSIS — Q7649 Other congenital malformations of spine, not associated with scoliosis: Secondary | ICD-10-CM | POA: Diagnosis not present

## 2017-07-17 DIAGNOSIS — M4186 Other forms of scoliosis, lumbar region: Secondary | ICD-10-CM | POA: Insufficient documentation

## 2017-07-17 DIAGNOSIS — M5093 Cervical disc disorder, unspecified, cervicothoracic region: Secondary | ICD-10-CM | POA: Insufficient documentation

## 2017-07-17 DIAGNOSIS — M25512 Pain in left shoulder: Secondary | ICD-10-CM | POA: Diagnosis not present

## 2017-07-17 DIAGNOSIS — Z79891 Long term (current) use of opiate analgesic: Secondary | ICD-10-CM | POA: Diagnosis not present

## 2017-07-17 DIAGNOSIS — Z79899 Other long term (current) drug therapy: Secondary | ICD-10-CM | POA: Diagnosis not present

## 2017-07-17 DIAGNOSIS — E785 Hyperlipidemia, unspecified: Secondary | ICD-10-CM | POA: Diagnosis not present

## 2017-07-17 DIAGNOSIS — M7522 Bicipital tendinitis, left shoulder: Secondary | ICD-10-CM | POA: Diagnosis not present

## 2017-07-17 DIAGNOSIS — M899 Disorder of bone, unspecified: Secondary | ICD-10-CM | POA: Diagnosis not present

## 2017-07-17 DIAGNOSIS — G894 Chronic pain syndrome: Secondary | ICD-10-CM | POA: Diagnosis not present

## 2017-07-17 DIAGNOSIS — M7592 Shoulder lesion, unspecified, left shoulder: Secondary | ICD-10-CM | POA: Insufficient documentation

## 2017-07-17 NOTE — Progress Notes (Signed)
Results were reviewed and found to be: mildly abnormal  No acute injury or pathology identified  Review would suggest interventional pain management techniques may be of benefit 

## 2017-07-17 NOTE — Patient Instructions (Signed)

## 2017-07-17 NOTE — Progress Notes (Signed)
Safety precautions to be maintained throughout the outpatient stay will include: orient to surroundings, keep bed in low position, maintain call bell within reach at all times, provide assistance with transfer out of bed and ambulation.  

## 2017-07-17 NOTE — Progress Notes (Signed)
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 kg/m2 Underweight   18.5-24.9 kg/m2 Ideal body weight   25-29.9 kg/m2 Overweight Increased incidence by 20%  30-34.9 kg/m2 Obese (Class I) Increased incidence by 68%  35-39.9 kg/m2 Severe obesity (Class II) Increased incidence by 136%  >40 kg/m2 Extreme obesity (Class III) Increased incidence by 254%   BMI Readings from Last 4 Encounters:  07/17/17 31.04 kg/m  11/28/16 29.70 kg/m  05/11/16 30.43 kg/m  09/04/13 30.43 kg/m   Wt Readings from Last 4 Encounters:  07/17/17 255 lb (115.7 kg)  11/28/16 244 lb (110.7 kg)  05/11/16 250 lb (113.4 kg)  09/04/13 250 lb (113.4 kg)  Psych/Mental status: Alert, oriented x 3 (person, place, & time)       Eyes: PERLA Respiratory: No evidence of acute respiratory distress  Cervical Spine Exam  Inspection: Well healed scar from previous spine surgery detected Alignment: Symmetrical Functional ROM: Decreased ROM      Stability: No instability detected Muscle strength & Tone: Functionally intact Sensory:  Unimpaired Palpation: No palpable anomalies              Upper Extremity (UE) Exam    Side: Right upper extremity  Side: Left upper extremity  Inspection: No masses, redness, swelling, or asymmetry. No contractures  Inspection: No masses, redness, swelling, or asymmetry. No contractures  Functional ROM: Decreased ROM          Functional ROM: Decreased ROM          Muscle strength & Tone: Functionally intact  Muscle strength & Tone: Functionally intact  Sensory: Unimpaired  Sensory: Unimpaired  Palpation: No palpable anomalies              Palpation: No palpable anomalies              Specialized Test(s): Deferred         Specialized Test(s): Deferred          Thoracic Spine Exam  Inspection: No masses, redness, or swelling Alignment: Symmetrical Functional ROM: Unrestricted ROM Stability: No instability detected Sensory: Unimpaired Muscle strength & Tone: No palpable anomalies  Lumbar Spine Exam  Inspection: No masses, redness, or swelling Alignment: Symmetrical Functional ROM: Unrestricted ROM  Stability: No instability detected Muscle strength & Tone: Functionally intact Sensory: Unimpaired Palpation: No palpable anomalies       Provocative Tests: Lumbar Hyperextension and rotation test: Positive bilaterally for facet joint pain. Leg raise is positive at 45 bilaterally Patrick's Maneuver: Positive for bilateral S-I arthralgia              Gait & Posture Assessment  Ambulation: Unassisted Gait: Relatively normal for age and body habitus Posture: WNL   Lower Extremity Exam    Side: Right lower extremity  Side: Left lower extremity  Inspection: No masses, redness, swelling, or asymmetry. No contractures  Inspection: No masses, redness, swelling, or asymmetry. No contractures  Functional ROM: Decreased ROM for knee joint  Functional ROM: Decreased ROM for knee joint  Muscle strength & Tone: Able to Toe-walk & Heel-walk without problems  Muscle strength & Tone: Able to  Toe-walk & Heel-walk without problems  Sensory: Unimpaired  Sensory: Unimpaired  Palpation: No palpable anomalies  Palpation: No palpable anomalies   Assessment  Primary Diagnosis & Pertinent Problem List: The primary encounter diagnosis was Spondylosis of cervical region without myelopathy or radiculopathy. Diagnoses of Chronic bilateral low back pain without sciatica, Chronic sacroiliac joint pain, Chronic pain of both knees (Fourth Area of Pain) (L>R), Chronic left shoulder pain, Chronic pain syndrome, Long term current use of opiate analgesic, Pharmacologic therapy, Disorder of skeletal system, and Problems influencing health status were also pertinent to this visit.  Visit Diagnosis: 1. Spondylosis of cervical region without myelopathy or radiculopathy   2. Chronic bilateral low back pain without sciatica   3. Chronic sacroiliac joint pain   4. Chronic pain of both knees (Fourth Area of Pain) (L>R)   5. Chronic left shoulder pain   6. Chronic pain syndrome   7. Long term current use of opiate analgesic   8. Pharmacologic therapy   9. Disorder of skeletal system   10. Problems influencing health status    Plan of Care  Initial treatment plan:  Please be advised that as per protocol, today's visit has been an evaluation only. We have not taken over the patient's controlled substance management.  Problem-specific plan: No problem-specific Assessment & Plan notes found for this encounter.  Ordered Lab-work, Procedure(s), Referral(s), & Consult(s): Orders Placed This Encounter  Procedures  . DG Lumbar Spine Complete W/Bend  . DG Knee 1-2 Views Left  . DG Knee 1-2 Views Right  . DG Si Joints  . DG Cervical Spine Complete  . Compliance Drug Analysis, Ur  . Comp. Metabolic Panel (12)  . Magnesium  . Vitamin B12  . 25-Hydroxyvitamin D Lcms D2+D3  . Sedimentation rate  . C-reactive protein  . Ambulatory referral to Psychology   Pharmacotherapy: Medications ordered:  No orders  of the defined types were placed in this encounter.  Medications administered during this visit: Nolon Rod had no medications administered during this visit.   Pharmacotherapy under consideration:  Opioid Analgesics: The patient was informed that there is no guarantee that he would be a candidate for opioid analgesics. The decision will be made following CDC guidelines. This decision will be based on the results of diagnostic studies, as well as Mr. Dafoe risk profile.  Membrane stabilizer: To be determined at a later time Muscle relaxant: To be determined at a later time NSAID: To be determined at a later time Other analgesic(s): To be determined at a later time   Interventional therapies under consideration: Mr. Burgert was informed that there is  no guarantee that he would be a candidate for interventional therapies. The decision will be based on the results of diagnostic studies, as well as Mr. Overbeck risk profile.  Possible procedure(s): Diagnostic bilateral cervical facet nerve block Possible bilateral cervical facet RFA Diagnostic bilateral lumbar facet nerve block Possible bilateral lumbar facet RFA Diagnostic bilateral sacroiliac joint injection Diagnostic left genicular nerve block Possible left genicular nerve RFA Diagnostic right-sided Hyalgan series Diagnostic right-sided intra-articular knee injection Diagnostic left suprascapular nerve block Possible left-sided suprascapular RFA    Provider-requested follow-up: Return for 2nd Visit, w/ Dr. Dossie Arbour, after [facility] eval.  No future appointments.  Primary Care Physician: Jodi Marble, MD Location: [facility] Note by:  Date: 07/17/2017; Time: 1:46 PM  Pain Score Disclaimer: We use the NRS-11 scale. This is a self-reported, subjective measurement of pain severity with only modest accuracy. It is used primarily to identify changes within a particular patient. It must be understood  that outpatient pain scales are significantly less accurate that those used for research, where they can be applied under ideal controlled circumstances with minimal exposure to variables. In reality, the score is likely to be a combination of pain intensity and pain affect, where pain affect describes the degree of emotional arousal or changes in action readiness caused by the sensory experience of pain. Factors such as social and work situation, setting, emotional state, anxiety levels, expectation, and prior pain experience may influence pain perception and show large inter-individual differences that may also be affected by time variables.  Patient instructions provided during this appointment: Patient Instructions    ____________________________________________________________________________________________  Appointment Policy Summary  It is our goal and responsibility to provide the medical community with assistance in the evaluation and management of patients with chronic pain. Unfortunately our resources are limited. Because we do not have an unlimited amount of time, or available appointments, we are required to closely monitor and manage their use. The following rules exist to maximize their use:  Patient's responsibilities: 1. Punctuality:  At what time should I arrive? You should be physically present in our office 30 minutes before your scheduled appointment. Your scheduled appointment is with your assigned healthcare provider. However, it takes 5-10 minutes to be "checked-in", and another 15 minutes for the nurses to do the admission. If you arrive to our office at the time you were given for your appointment, you will end up being at least 20-25 minutes late to your appointment with the provider. 2. Tardiness:  What happens if I arrive only a few minutes after my scheduled appointment time? You will need to reschedule your appointment. The cutoff is your appointment time. This is why  it is so important that you arrive at least 30 minutes before that appointment. If you have an appointment scheduled for 10:00 AM and you arrive at 10:01, you will be required to reschedule your appointment.  3. Plan ahead:  Always assume that you will encounter traffic on your way in. Plan for it. If you are dependent on a driver, make sure they understand these rules and the need to arrive early. 4. Other appointments and responsibilities:  Avoid scheduling any other appointments before or after your pain clinic appointments.  5. Be prepared:  Write down everything that you need to discuss with your healthcare provider and give this information to the admitting nurse. Write down the medications that you will need refilled. Bring your pills and bottles (even the empty ones), to all of your appointments,  except for those where a procedure is scheduled. 6. No children or pets:  Find someone to take care of them. It is not appropriate to bring them in. 7. Scheduling changes:  We request "advanced notification" of any changes or cancellations. 8. Advanced notification:  Defined as a time period of more than 24 hours prior to the originally scheduled appointment. This allows for the appointment to be offered to other patients. 9. Rescheduling:  When a visit is rescheduled, it will require the cancellation of the original appointment. For this reason they both fall within the category of "Cancellations".  10. Cancellations:  They require advanced notification. Any cancellation less than 24 hours before the  appointment will be recorded as a "No Show". 11. No Show:  Defined as an unkept appointment where the patient failed to notify or declare to the practice their intention or inability to keep the appointment.  Corrective process for repeat offenders:  1. Tardiness: Three (3) episodes of rescheduling due to late arrivals will be recorded as one (1) "No Show". 2. Cancellation or reschedule: Three  (3) cancellations or rescheduling will be recorded as one (1) "No Show". 3. "No Shows": Three (3) "No Shows" within a 12 month period will result in discharge from the practice.  ____________________________________________________________________________________________   ____________________________________________________________________________________________  Pain Scale  Introduction: The pain score used by this practice is the Verbal Numerical Rating Scale (VNRS-11). This is an 11-point scale. It is for adults and children 10 years or older. There are significant differences in how the pain score is reported, used, and applied. Forget everything you learned in the past and learn this scoring system.  General Information: The scale should reflect your current level of pain. Unless you are specifically asked for the level of your worst pain, or your average pain. If you are asked for one of these two, then it should be understood that it is over the past 24 hours.  Basic Activities of Daily Living (ADL): Personal hygiene, dressing, eating, transferring, and using restroom.  Instructions: Most patients tend to report their level of pain as a combination of two factors, their physical pain and their psychosocial pain. This last one is also known as "suffering" and it is reflection of how physical pain affects you socially and psychologically. From now on, report them separately. From this point on, when asked to report your pain level, report only your physical pain. Use the following table for reference.  Pain Clinic Pain Levels (0-5/10)  Pain Level Score  Description  No Pain 0   Mild pain 1 Nagging, annoying, but does not interfere with basic activities of daily living (ADL). Patients are able to eat, bathe, get dressed, toileting (being able to get on and off the toilet and perform personal hygiene functions), transfer (move in and out of bed or a chair without assistance), and maintain  continence (able to control bladder and bowel functions). Blood pressure and heart rate are unaffected. A normal heart rate for a healthy adult ranges from 60 to 100 bpm (beats per minute).   Mild to moderate pain 2 Noticeable and distracting. Impossible to hide from other people. More frequent flare-ups. Still possible to adapt and function close to normal. It can be very annoying and may have occasional stronger flare-ups. With discipline, patients may get used to it and adapt.   Moderate pain 3 Interferes significantly with activities of daily living (ADL). It becomes difficult to feed, bathe, get dressed, get on and off the toilet  or to perform personal hygiene functions. Difficult to get in and out of bed or a chair without assistance. Very distracting. With effort, it can be ignored when deeply involved in activities.   Moderately severe pain 4 Impossible to ignore for more than a few minutes. With effort, patients may still be able to manage work or participate in some social activities. Very difficult to concentrate. Signs of autonomic nervous system discharge are evident: dilated pupils (mydriasis); mild sweating (diaphoresis); sleep interference. Heart rate becomes elevated (>115 bpm). Diastolic blood pressure (lower number) rises above 100 mmHg. Patients find relief in laying down and not moving.   Severe pain 5 Intense and extremely unpleasant. Associated with frowning face and frequent crying. Pain overwhelms the senses.  Ability to do any activity or maintain social relationships becomes significantly limited. Conversation becomes difficult. Pacing back and forth is common, as getting into a comfortable position is nearly impossible. Pain wakes you up from deep sleep. Physical signs will be obvious: pupillary dilation; increased sweating; goosebumps; brisk reflexes; cold, clammy hands and feet; nausea, vomiting or dry heaves; loss of appetite; significant sleep disturbance with inability to  fall asleep or to remain asleep. When persistent, significant weight loss is observed due to the complete loss of appetite and sleep deprivation.  Blood pressure and heart rate becomes significantly elevated. Caution: If elevated blood pressure triggers a pounding headache associated with blurred vision, then the patient should immediately seek attention at an urgent or emergency care unit, as these may be signs of an impending stroke.    Emergency Department Pain Levels (6-10/10)  Emergency Room Pain 6 Severely limiting. Requires emergency care and should not be seen or managed at an outpatient pain management facility. Communication becomes difficult and requires great effort. Assistance to reach the emergency department may be required. Facial flushing and profuse sweating along with potentially dangerous increases in heart rate and blood pressure will be evident.   Distressing pain 7 Self-care is very difficult. Assistance is required to transport, or use restroom. Assistance to reach the emergency department will be required. Tasks requiring coordination, such as bathing and getting dressed become very difficult.   Disabling pain 8 Self-care is no longer possible. At this level, pain is disabling. The individual is unable to do even the most "basic" activities such as walking, eating, bathing, dressing, transferring to a bed, or toileting. Fine motor skills are lost. It is difficult to think clearly.   Incapacitating pain 9 Pain becomes incapacitating. Thought processing is no longer possible. Difficult to remember your own name. Control of movement and coordination are lost.   The worst pain imaginable 10 At this level, most patients pass out from pain. When this level is reached, collapse of the autonomic nervous system occurs, leading to a sudden drop in blood pressure and heart rate. This in turn results in a temporary and dramatic drop in blood flow to the brain, leading to a loss of  consciousness. Fainting is one of the body's self defense mechanisms. Passing out puts the brain in a calmed state and causes it to shut down for a while, in order to begin the healing process.    Summary: 1. Refer to this scale when providing [place] with your pain level. 2. Be accurate and careful when reporting your pain level. This will help with your care. 3. Over-reporting your pain level will lead to loss of credibility. 4. Even a level of 1/10 means that there is pain and will be treated  at our facility. 5. High, inaccurate reporting will be documented as "Symptom Exaggeration", leading to loss of credibility and suspicions of possible secondary gains such as obtaining more narcotics, or wanting to appear disabled, for fraudulent reasons. 6. Only pain levels of 5 or below will be seen at our facility. 7. Pain levels of 6 and above will be sent to the Emergency Department and the appointment cancelled. ____________________________________________________________________________________________

## 2017-07-17 NOTE — Progress Notes (Signed)
Results were reviewed and found to be: abnormal  No acute injury or pathology identified Further testing may be useful  Review would suggest interventional pain management techniques may be of benefit

## 2017-07-19 ENCOUNTER — Other Ambulatory Visit: Payer: Self-pay | Admitting: Nurse Practitioner

## 2017-07-19 ENCOUNTER — Encounter: Payer: Self-pay | Admitting: Nurse Practitioner

## 2017-07-19 DIAGNOSIS — R7982 Elevated C-reactive protein (CRP): Secondary | ICD-10-CM

## 2017-07-19 DIAGNOSIS — R700 Elevated erythrocyte sedimentation rate: Secondary | ICD-10-CM

## 2017-07-21 LAB — COMPLIANCE DRUG ANALYSIS, UR

## 2017-07-22 DIAGNOSIS — M542 Cervicalgia: Secondary | ICD-10-CM

## 2017-07-22 DIAGNOSIS — M79605 Pain in left leg: Secondary | ICD-10-CM

## 2017-07-22 DIAGNOSIS — F1190 Opioid use, unspecified, uncomplicated: Secondary | ICD-10-CM | POA: Insufficient documentation

## 2017-07-22 DIAGNOSIS — G8929 Other chronic pain: Secondary | ICD-10-CM | POA: Insufficient documentation

## 2017-07-22 DIAGNOSIS — M79604 Pain in right leg: Secondary | ICD-10-CM

## 2017-07-22 DIAGNOSIS — Z9889 Other specified postprocedural states: Secondary | ICD-10-CM | POA: Insufficient documentation

## 2017-07-22 NOTE — Progress Notes (Addendum)
Patient's Name: Gabriel Russell  MRN: [id number]  Referring Provider: Jodi Marble, MD  DOB: November 22, 1955  PCP: Jodi Marble, MD  DOS: 07/23/2017  Note by: Gaspar Cola, MD  Service setting: Ambulatory outpatient  Specialty: Interventional Pain Management  Location: [facility] (AMB) Pain Management Facility    Patient type: Established   Primary Reason(s) for Visit: Encounter for evaluation before starting new chronic pain management plan of care (Level of risk: moderate) CC: Back Pain (mid and low); Leg Pain (posterior to knee); and Neck Pain  HPI  [name] is a 62 y.o. year old, male patient, who comes today for a follow-up evaluation to review the test results and decide on a treatment plan. 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); Rotator cuff Tendinitis (Left); HTN (hypertension); Upper biceps tendon Tendonitis (Left); Cervical Spondylosis; Chronic low back pain (Secondary Area of Pain) (Bilateral) (L>R); Chronic sacroiliac joint pain (Bilateral) (L>R); Chronic pain of knees (Fourth Area of Pain) (Bilateral) (L>R); Chronic shoulder pain (Fifth Area of Pain) (Left); Chronic pain syndrome; Long term current use of opiate analgesic; Pharmacologic therapy; Disorder of skeletal system; Problems influencing health status; Elevated C-reactive protein (CRP); Elevated sed rate; Chronic neck pain (Primary Area of Pain) (Bilateral) (L>R); History of cervical spinal surgery; Chronic lower extremity pain (Tertiary Area of Pain) (Bilateral) (L>R); Opiate use (30 MME/Day); Vitamin D deficiency; Cervical facet syndrome (Bilateral) (L>R); Cervical foraminal stenosis (Right: C5-6) (Left: C6-7); Lumbar facet arthropathy (Bilateral); Lumbar facet syndrome (Bilateral) (L>R); Lumbar facet osteoarthritis (Bilateral); and History of marijuana use on their problem list. His primarily concern today is the Back Pain (mid and low); Leg Pain (posterior to knee); and Neck Pain  Pain Assessment: Location: Posterior  Neck(legs, back) Radiating: low back, legs Duration: Chronic pain Quality: Constant, Aching, Burning, Stabbing Severity: 7 /10 (self-reported pain score)  Note: Reported level is inconsistent with clinical observations. Clinically the patient looks like a 2/10 A 2/10 is viewed as "Mild to Moderate" and described as noticeable and distracting. Impossible to hide from other people. More frequent flare-ups. Still possible to adapt and function close to normal. It can be very annoying and may have occasional stronger flare-ups. With discipline, patients may get used to it and adapt. Information on the proper use of the pain scale provided to the patient today. When using our objective Pain Scale, levels between 6 and 10/10 are said to belong in an emergency room, as it progressively worsens from a 6/10, described as severely limiting, requiring emergency care not usually available at an outpatient pain management facility. At a 6/10 level, communication becomes difficult and requires great effort. Assistance to reach the emergency department may be required. Facial flushing and profuse sweating along with potentially dangerous increases in heart rate and blood pressure will be evident. Timing: Constant Modifying factors: hot baths  [name] comes in today for a follow-up visit after his initial evaluation on 07/17/2017. Today we went over the results of his tests. These were explained in "Layman's terms". During today's appointment we went over my diagnostic impression, as well as the proposed treatment plan.  According to the patient's primary area of pain is in his neck (B)(L>R). He admits this is related to fall down stairs (3 years ago). He is s/p right anterior cervical fusion (2016). He states this was not effective for the neck pain. Before surgery, he had constant numbness and weakness on the left upper extremity, now it is intermittent (somewhat numb, but not completely numb, as before  surgery). LUE-  numbness goes to thumb, index, and middle fingers. Right arm hurts only if he lays on it. He feels it makes the pain worse. RUE- pain goes to thumb and index finger. He denies any interventional therapy. He did have 3 days of physical therapy. He admits that this also was not effective & not paid by insurance. He denies any recent images.  His second area of pain is in his lower back (B) (L>R). Goes to left leg. He denies any previous surgery, interventional therapy, physical therapy or recent images.  His third area of pain is in his lower extremities (L>R). He has pains that radiate from the back to the leg down to the knee (B), never below the knee.  His fourth area of pain is in his knees (B) (L>R). He admits that the left is greater than the right. He is s/p left total knee replacement (2016). He states that he needs a right total knee replacement however he has declined this at this time. He denies any interventional therapy. He admits that he did have physical therapy after surgery. He denies any recent images. Continues to have pain in the back of the left knee, he thinks it may be from the back.  His fifth area of pain is in his left shoulder. He did undergo arthroscopic surgery on his left shoulder. He admits this was not effective. He denies any interventional therapy. He did have physical therapy status post surgery.  Went to another pain clinic (Dr. Humphrey Rolls). Was treated with medications, but those did not work well. Pain is worse in the mornings.  Today I asked the patient about the medications that he was taking and he indicated that he has not taken any medication since mid January.  However, a review of his PMP reveals that he had a prescription for OxyContin ER 10 mg #60 pills, written and filled on 06/19/2017.  The instructions on the prescription was to take 1 p.o. twice daily.  Based on that, he should have had enough medication to last until February 14.  However, his 07/17/2017 UDS came  back negative for any oxycodone indicating that he did not take them as prescribed.  In considering the treatment plan options, [name] was reminded that I no longer take patients for medication management only. I asked him to let me know if he had no intention of taking advantage of the interventional therapies, so that we could make arrangements to provide this space to someone interested. I also made it clear that undergoing interventional therapies for the purpose of getting pain medications is very inappropriate on the part of a patient, and it will not be tolerated in this practice. This type of behavior would suggest true addiction and therefore it requires referral to an addiction specialist.   Today the patient has informed us that he is not interested in any type of interventional therapies.  Since we currently do not have the manpower to take on patient's for medication management only, we will limit this visit to informing the patient of his options and if he eventually decides to take up our treatment plan options, will be available for him.  However, I was very clear that it is not likely that we will be prescribing any controlled substances to him.  Further details on both, my assessment(s), as well as the proposed treatment plan, please see below.  Controlled Substance Pharmacotherapy Assessment REMS (Risk Evaluation and Mitigation Strategy)  Analgesic: OxyContin ER 10  mg 1 tablet twice daily (last fill date 06/19/2017) (20 mg/day of oxycodone) (the patient comes into the clinic today indicating that he has been out of medicine since "mid February".) Highest recorded MME/day: 120 mg/day MME/day: 30 mg/day Pill Count: None expected due to no prior prescriptions written by our practice. Hart Rochester, RN  07/23/2017  8:25 AM  Sign at close encounter Safety precautions to be maintained throughout the outpatient stay will include: orient to surroundings, keep bed in low position,  maintain call bell within reach at all times, provide assistance with transfer out of bed and ambulation.    Pharmacokinetics: Liberation and absorption (onset of action): Longer than expected ("Never did") Distribution (time to peak effect): Longer than expected (2 days) Metabolism and excretion (duration of action): Felt that it did not help at all. Had no withdrawals after he stopped taking it. Pharmacodynamics: Desired effects: Analgesia: [name] reports <50% benefit. Functional ability: Patient reports that medication allows him to accomplish basic ADLs Clinically meaningful improvement in function (CMIF): Sustained CMIF goals met Perceived effectiveness: Described as relatively effective, allowing for increase in activities of daily living (ADL) Undesirable effects: Side-effects or Adverse reactions: Impotence, possibly due to low testosterone levels. Monitoring: Gabriel Russell PMP: Online review of the past 71-monthperiod previously conducted. Not applicable at this point since we have not taken over the patient's medication management yet. List of other Serum/Urine Drug Screening Test(s):  Lab Results  Component Value Date   COCAINSCRNUR NONE DETECTED 05/11/2016   COCAINSCRNUR NEGATIVE 01/12/2013   THCU POSITIVE (A) 05/11/2016   THCU POSITIVE 01/12/2013   List of all UDS test(s) done:  Lab Results  Component Value Date   SUMMARY FINAL 07/17/2017   Last UDS on record: Summary  Date Value Ref Range Status  07/17/2017 FINAL  Final    Comment:    ==================================================================== TOXASSURE COMP DRUG ANALYSIS,UR ==================================================================== Test                             Result       Flag       Units Drug Present and Declared for Prescription Verification   Naproxen                       PRESENT      EXPECTED Drug Absent but Declared for Prescription Verification   Cyclobenzaprine                Not  Detected UNEXPECTED ==================================================================== Test                      Result    Flag   Units      Ref Range   Creatinine              327              mg/dL      >=20 ==================================================================== Declared Medications:  The flagging and interpretation on this report are based on the  following declared medications.  Unexpected results may arise from  inaccuracies in the declared medications.  **Note: The testing scope of this panel includes these medications:  Cyclobenzaprine  Naproxen  **Note: The testing scope of this panel does not include following  reported medications:  Amlodipine (Amlodipine Besy-Benazepril HCl)  Atorvastatin  Benazepril (Amlodipine Besy-Benazepril HCl)  Chlorthalidone  Dutasteride  Pantoprazole  Sildenafil  Tamsulosin ==================================================================== For clinical consultation, please call [phone]. ====================================================================    UDS interpretation: Unexpected findings: Based on the patient's PMP, he had a prescription for OxyContin ER 10 mg #60 pills, failed on 06/19/2017, which should have lasted until 07/19/2017.  This means that if the patient had no oxycodone or metabolites in his urine by 07/17/2017, he was not taking his medications correctly and perhaps had taken all of them well before we did the urine drug screen test, suggesting "binging". Medication Assessment Form: Not applicable. No opioids. Treatment compliance: Non-compliant Risk Assessment Profile: Aberrant behavior: appearance of intoxication or being "high", claims that "nothing else works", continued use despite claims of ineffective analgesia, diminished ability to recognize a problem with one's behavior or use of the medication, drug binging, extensive time discussing medicaiton, impaired control over use of medications,  inability to consider abstinence, request for specific drugs or requesting "Brand Name" drugs, resistance to changing therapy, significant opioid tolerance, taking more medication than prescribed, unsafe use of medication and use of illicit substances Comorbid factors increasing risk of overdose: arrested more than 3 times in life, drug-related convictions or arrests, history of substance use disorder and male gender Medical Psychology Evaluation: Please see scanned results in medical record.  Opioid Risk Tool - 07/17/17 0830      Family History of Substance Abuse   Alcohol  Positive Male    Illegal Drugs  Negative    Rx Drugs  Negative      Personal History of Substance Abuse   Alcohol  Negative    Illegal Drugs  Negative    Rx Drugs  Negative      Age   Age between 29-45 years   No      History of Preadolescent Sexual Abuse   History of Preadolescent Sexual Abuse  Negative or Male      Psychological Disease   Psychological Disease  Negative    Depression  Positive      Total Score   Opioid Risk Tool Scoring  4    Opioid Risk Interpretation  Moderate Risk      ORT Scoring interpretation table:  Score <3 = Low Risk for SUD  Score between 4-7 = Moderate Risk for SUD  Score >8 = High Risk for Opioid Abuse   Risk Mitigation Strategies:  Patient opioid safety counseling: Opioid therapy will not be included in the treatment plan. Patient-Prescriber Agreement (PPA): No agreement signed.  Controlled substance notification to other providers: None required. No opioid therapy.  Pharmacologic Plan: [name] is not a candidate for opioid therapy at this time. [name] has indicated not being interested in our services, at this time.  Laboratory Chemistry  Inflammation Markers (CRP: Acute Phase) (ESR: Chronic Phase) Lab Results  Component Value Date   CRP 5.2 (H) 07/17/2017   ESRSEDRATE 44 (H) 07/17/2017                         Rheumatology Markers Lab Results  Component  Value Date   RF <10.0 07/17/2017   ANA Negative 07/17/2017                Renal Function Markers Lab Results  Component Value Date   BUN 14 07/17/2017   CREATININE 1.04 07/17/2017   GFRAA 89 07/17/2017   GFRNONAA 77 07/17/2017                 Hepatic Function Markers Lab Results  Component Value Date   AST 20 07/17/2017  ALT 19 11/28/2016   ALBUMIN 4.3 07/17/2017   ALKPHOS 114 07/17/2017                 Electrolytes Lab Results  Component Value Date   NA 141 07/17/2017   K 4.1 07/17/2017   CL 103 07/17/2017   CALCIUM 9.5 07/17/2017   MG 2.0 07/17/2017                        Neuropathy Markers Lab Results  Component Value Date   VITAMINB12 384 07/17/2017                 Bone Pathology Markers Lab Results  Component Value Date   25OHVITD1 15 (L) 07/17/2017   25OHVITD2 <1.0 07/17/2017   25OHVITD3 15 07/17/2017                         Coagulation Parameters Lab Results  Component Value Date   INR 1.0 04/09/2013   LABPROT 13.3 04/09/2013   APTT 32.9 04/09/2013   PLT 155 11/28/2016                 Cardiovascular Markers Lab Results  Component Value Date   CKTOTAL 2,280 (H) 01/12/2013   TROPONINI <0.03 11/28/2016   HGB 14.5 11/28/2016   HCT 41.1 11/28/2016                 Note: Lab results reviewed.  Recent Diagnostic Imaging Review  Cervical Imaging: Cervical MR w/wo contrast:  Results for orders placed during the hospital encounter of 09/15/14  MR Cervical Spine W Wo Contrast   Narrative * PRIOR REPORT IMPORTED FROM AN EXTERNAL SYSTEM *  CLINICAL DATA:  History of cervical fusion 1 year ago. Recurrent neck pain and mid back pain. No radicular symptoms.  EXAM: MRI CERVICAL SPINE WITHOUT AND WITH CONTRAST  TECHNIQUE: Multiplanar and multiecho pulse sequences of the cervical spine, to include the craniocervical junction and cervicothoracic junction, were obtained without and with intravenous contrast.  CONTRAST:  20 cc  MultiHance  COMPARISON:  08/02/2013  FINDINGS: Anterior and interbody fusion changes noted at C3-4, C4-5, C5-6 and C6-7. No definite complicating features. Associated moderate artifact. 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 No new cord lesions are demonstrated.  C2-3: Mild annular bulge and mild uncinate spurring changes. Mild left foraminal encroachment. No spinal stenosis.  C3-4: Anterior and interbody fusion changes. There is osteophytic ridging posteriorly with flattening of the ventral thecal sac and mild to moderate bilateral foraminal stenosis. Right-sided cord signal abnormality again demonstrated.  C4-5: Osteophytic ridging and uncinate spurring with mild to moderate bilateral foraminal stenosis. No significant spinal stenosis. Stable areas of cord signal abnormality.  C5-6: Moderate-sized central spurring changes and possibly some residual disc material causing mass effect on the ventral thecal sac and narrowing of the ventral CSF space just to the right of midline. There is also moderate uncinate spurring changes on the right side with moderate right foraminal stenosis.  C6-7: Central osteophytic spurring off the C6 vertebral body with mass effect on the thecal sac asymmetrical left with narrowing of the ventral CSF space. There is also significant left foraminal stenosis due to marked uncinate spurring change. No right-sided foraminal stenosis.  C7-T1: Shallow central and left paracentral disc protrusion with mild mass effect on the thecal sac. Mild  foraminal encroachment bilaterally and moderate facet disease.  IMPRESSION: Extensive surgical changes with C3-C7 anterior and interbody fusion.  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  Osteophytic ridging, uncinate spurring and facet disease contributing to multilevel spinal and foraminal stenosis as discussed above at the individual levels. The most significant findings are mild spinal stenosis and right foraminal stenosis at C5-6 and left foraminal stenosis at C6-7.   Electronically Signed   By: Marijo Sanes M.D.   On: 09/15/2014 16:19    Cervical DG 2-3 views:  Results for orders placed during the hospital encounter of 09/04/13  DG Cervical Spine 2-3 Views   Narrative CLINICAL DATA:  Multilevel cervical disc disease with myelopathy.  EXAM: CERVICAL SPINE - 2-3 VIEW; DG C-ARM 1-60 MIN  COMPARISON:  MRI dated 08/02/2013  FINDINGS: Lateral C-arm images demonstrate the patient has undergone anterior cervical fusion at C3-4, C4-5, C5-6, and C6-7. Hardware appears in good position.  IMPRESSION: Anterior cervical fusions performed from C3-C7.   Electronically Signed   By: Rozetta Nunnery M.D.   On: 09/04/2013 16:49    Cervical DG complete:  Results for orders placed during the hospital encounter of 07/17/17  DG Cervical Spine Complete   Narrative CLINICAL DATA:  Cervical spondylosis  EXAM: CERVICAL SPINE - COMPLETE 4+ VIEW  COMPARISON:  MR cervical spine 09/15/2014  FINDINGS: Prevertebral soft tissues normal thickness.  Anterior plate and screws present C3-C7 post anterior fusion.  Disc prostheses at the intervening disc spaces of C3-C4, C4-C5, C5-C6, and C6-C7 again seen.  Bony fusion appears solid with hardware intact.  Cervical foramina incompletely profiled, questionably narrowed by uncovertebral hypertrophy at multiple levels bilaterally.  No acute fracture, dislocation, or bone destruction.  Lung apices clear.  IMPRESSION: Prior anterior fusion of C3-C7.  No acute abnormalities.  Suspect scattered encroachment upon cervical neural foramina bilaterally by uncovertebral hypertrophy.   Electronically Signed   By:  Lavonia Dana M.D.   On: 07/17/2017 10:20    Shoulder Imaging: Shoulder-L MR wo contrast:  Results for orders placed during the hospital encounter of 04/06/16  MR SHOULDER LEFT WO CONTRAST   Narrative CLINICAL DATA:  Fall striking shoulder in September 2017.  EXAM: MRI OF THE LEFT SHOULDER WITHOUT CONTRAST  TECHNIQUE: Multiplanar, multisequence MR imaging of the shoulder was performed. No intravenous contrast was administered.  COMPARISON:  None.  FINDINGS: Despite efforts by the technologist and patient, motion artifact is present on today's exam and could not be eliminated. This reduces exam sensitivity and specificity.  Rotator cuff: Full-thickness full width rupture of the supraspinatus tendon, the inferior anterior tendon is retracted 3 cm, the posterior tendon may be slightly tethered to the infraspinatus and is not significantly retracted. Moderate tendinopathy and bursal surface partial tearing of the infraspinatus. Full-thickness partial width tearing of the upper portion of the subscapularis with thinning of the rest of the subscapularis.  Muscles: Abnormal edema in the supraspinatus and infraspinatus muscles.  Biceps long head:  Mild tendinopathy.  Acromioclavicular Joint: Moderate to prominent spurring with moderate degenerative subcortical marrow edema. Type II acromion. Considerable fluid in the subacromial subdeltoid bursa with associated synovitis.  Glenohumeral Joint: Glenohumeral joint effusion with mild to moderate degenerative chondral thinning. The joint freely communicates with the subacromial subdeltoid bursa.  Labrum:  Unremarkable  Bones: Small degenerative subcortical cystic lesion along the lateral anatomic neck of the humerus.  Other: No supplemental non-categorized findings.  IMPRESSION: 1. Full-thickness full  width rupture of the supraspinatus tendon, with 3 cm retraction anteriorly but no significant retraction posteriorly. 2.  Moderate infraspinatus tendinopathy with bursal surface partial tearing. 3. Full-thickness partial width tearing of the upper portion of the subscapularis tendon with subscapularis thinning. 4. Abnormal edema in the supraspinatus and infraspinatus muscles. 5. Mild biceps tendinopathy. 6. Moderate to prominent degenerative AC joint arthropathy and mild to moderate degenerative glenohumeral arthropathy. 7. Joint effusion freely communicates with the subacromial subdeltoid bursa. Associated synovitis in the bursa.   Electronically Signed   By: Van Clines M.D.   On: 04/06/2016 13:47    Lumbosacral Imaging: Lumbar DG Bending views:  Results for orders placed during the hospital encounter of 07/17/17  DG Lumbar Spine Complete W/Bend   Narrative CLINICAL DATA:  Chronic back pain.  EXAM: LUMBAR SPINE - COMPLETE WITH BENDING VIEWS  COMPARISON:  MRI 07/25/2013.  FINDINGS: Mild scoliosis concave right. Diffuse degenerative change. No acute bony abnormality identified. No evidence of fracture.  IMPRESSION: Mild scoliosis concave right. Diffuse degenerative change. No acute bony abnormality.   Electronically Signed   By: Marcello Moores  Register   On: 07/17/2017 10:15    Sacroiliac Joint Imaging: Sacroiliac Joint DG:  Results for orders placed during the hospital encounter of 07/17/17  DG Si Joints   Narrative CLINICAL DATA:  Chronic low back pain, degenerative disc disease changes  EXAM: BILATERAL SACROILIAC JOINTS - 3+ VIEW  COMPARISON:  Lumbar spine radiographs 07/17/2017  FINDINGS: SI joints preserved.  Partial sacralization of the transverse processes of L5 with an assimilation joint on the LEFT.  Disc space narrowing with endplate spur formation and discogenic sclerosis at L4-L5.  Additional endplate spur formation at L5-S1.  Sacral foramina symmetric.  No acute fracture, dislocation, or bone destruction.  IMPRESSION: Unremarkable SI joints.  Partial  sacralization of L5 with degenerative disc disease changes at visualized lower lumbar spine.   Electronically Signed   By: Lavonia Dana M.D.   On: 07/17/2017 10:18    Knee Imaging: Knee-R DG 1-2 views:  Results for orders placed during the hospital encounter of 07/17/17  DG Knee 1-2 Views Right   Narrative CLINICAL DATA:  Chronic RIGHT knee pain, arthralgia  EXAM: RIGHT KNEE - 1-2 VIEW  COMPARISON:  None  FINDINGS: Osseous mineralization low normal.  Tricompartmental degenerative changes with joint space narrowing and spur formation.  No acute fracture, dislocation, or bone destruction.  Small inferior patellar spur at quadriceps tendon insertion.  Mild infrapatellar soft tissue swelling anteriorly.  No knee joint effusion.  IMPRESSION: Tricompartmental osteoarthritic changes RIGHT knee.   Electronically Signed   By: Lavonia Dana M.D.   On: 07/17/2017 10:16    Knee-L DG 1-2 views:  Results for orders placed during the hospital encounter of 07/17/17  DG Knee 1-2 Views Left   Narrative CLINICAL DATA:  Chronic LEFT knee pain, arthralgia  EXAM: LEFT KNEE - 1-2 VIEW  COMPARISON:  None  FINDINGS: Osseous mineralization normal.  Components of a LEFT knee prosthesis are again identified.  No acute fracture, dislocation, or bone destruction.  No periprosthetic lucency or knee joint effusion.  IMPRESSION: LEFT knee prosthesis without acute abnormalities.  The   Electronically Signed   By: Lavonia Dana M.D.   On: 07/17/2017 10:15    Complexity Note: Imaging results reviewed. Results shared with [name], using Layman's terms.                         Meds  Current Outpatient Medications:  .  amLODipine-valsartan (EXFORGE) 10-320 MG per tablet, Take 1 tablet by mouth every morning. , Disp: , Rfl:  .  atorvastatin (LIPITOR) 10 MG tablet, Take 10 mg by mouth every morning., Disp: , Rfl:  .  chlorthalidone (HYGROTON) 25 MG tablet, Take 1 tablet by mouth  daily., Disp: , Rfl: 2 .  cyclobenzaprine (FLEXERIL) 10 MG tablet, Take 1 tablet (10 mg total) by mouth 3 (three) times daily as needed for muscle spasms., Disp: 45 tablet, Rfl: 0 .  dutasteride (AVODART) 0.5 MG capsule, Take 0.5 mg by mouth daily., Disp: , Rfl:  .  naproxen (NAPROSYN) 500 MG tablet, Take 500 mg by mouth 2 (two) times daily with a meal., Disp: , Rfl:  .  pantoprazole (PROTONIX) 40 MG tablet, Take 40 mg by mouth as needed., Disp: , Rfl:  .  sildenafil (REVATIO) 20 MG tablet, Take 1-5 tablets by mouth daily as needed., Disp: , Rfl: 0 .  tamsulosin (FLOMAX) 0.4 MG CAPS capsule, Take 0.4 mg by mouth., Disp: , Rfl:   ROS  Constitutional: Denies any fever or chills Gastrointestinal: No reported hemesis, hematochezia, vomiting, or acute GI distress Musculoskeletal: Denies any acute onset joint swelling, redness, loss of ROM, or weakness Neurological: No reported episodes of acute onset apraxia, aphasia, dysarthria, agnosia, amnesia, paralysis, loss of coordination, or loss of consciousness  Allergies  [name] is allergic to amoxicillin and lisinopril.  [place]  Drug: [name]  reports that he does not use drugs. Alcohol:  reports that he does not drink alcohol. Tobacco:  reports that he has been smoking cigarettes.  He has smoked for the past 15.00 years. he has never used smokeless tobacco. Medical:  has a past medical history of Arthritis, Concussion, GERD (gastroesophageal reflux disease), Hyperlipidemia, Hypertension, Sleep apnea, and Subdural hematoma ([place]) (2014). Surgical: [name]  has a past surgical history that includes Anterior cervical decompression/discectomy fusion 4 level (N/A, 09/04/2013); Joint replacement (Left); Brain surgery; Shoulder arthroscopy with open rotator cuff repair (Left, 05/11/2016); Shoulder arthroscopy with subacromial decompression (Left, 05/11/2016); and Shoulder arthroscopy with debridement and bicep tendon repair (Left, 05/11/2016). Family: family  history includes Alcohol abuse in his father; Arthritis in his mother; Diabetes in his mother.  Constitutional Exam  General appearance: Well nourished, well developed, and well hydrated. In no apparent acute distress Vitals:   07/23/17 0818  BP: 138/64  Pulse: 79  Resp: 18  Temp: 98.3 F (36.8 C)  TempSrc: Oral  SpO2: 100%  Weight: 255 lb (115.7 kg)  Height: '6\' 4"'$  (1.93 m)   BMI Assessment: Estimated body mass index is 31.04 kg/m as calculated from the following:   Height as of this encounter: '6\' 4"'$  (1.93 m).   Weight as of this encounter: 255 lb (115.7 kg).  BMI interpretation table: BMI level Category Range association with higher incidence of chronic pain  <18 kg/m2 Underweight   18.5-24.9 kg/m2 Ideal body weight   25-29.9 kg/m2 Overweight Increased incidence by 20%  30-34.9 kg/m2 Obese (Class I) Increased incidence by 68%  35-39.9 kg/m2 Severe obesity (Class II) Increased incidence by 136%  >40 kg/m2 Extreme obesity (Class III) Increased incidence by 254%   BMI Readings from Last 4 Encounters:  07/23/17 31.04 kg/m  07/17/17 31.04 kg/m  11/28/16 29.70 kg/m  05/11/16 30.43 kg/m   Wt Readings from Last 4 Encounters:  07/23/17 255 lb (115.7 kg)  07/17/17 255 lb (115.7 kg)  11/28/16 244 lb (110.7 kg)  05/11/16 250 lb (  113.4 kg)  Psych/Mental status: Alert, oriented x 3 (person, place, & time)       Eyes: PERLA Respiratory: No evidence of acute respiratory distress  Cervical Spine Area Exam  Skin & Axial Inspection: No masses, redness, edema, swelling, or associated skin lesions Alignment: Symmetrical Functional ROM: Unrestricted ROM      Stability: No instability detected Muscle Tone/Strength: Functionally intact. No obvious neuro-muscular anomalies detected. Sensory (Neurological): Unimpaired Palpation: No palpable anomalies              Upper Extremity (UE) Exam    Side: Right upper extremity  Side: Left upper extremity  Skin & Extremity Inspection: Skin  color, temperature, and hair growth are WNL. No peripheral edema or cyanosis. No masses, redness, swelling, asymmetry, or associated skin lesions. No contractures.  Skin & Extremity Inspection: Skin color, temperature, and hair growth are WNL. No peripheral edema or cyanosis. No masses, redness, swelling, asymmetry, or associated skin lesions. No contractures.  Functional ROM: Unrestricted ROM          Functional ROM: Unrestricted ROM          Muscle Tone/Strength: Functionally intact. No obvious neuro-muscular anomalies detected.  Muscle Tone/Strength: Functionally intact. No obvious neuro-muscular anomalies detected.  Sensory (Neurological): Unimpaired          Sensory (Neurological): Unimpaired          Palpation: No palpable anomalies              Palpation: No palpable anomalies              Specialized Test(s): Deferred         Specialized Test(s): Deferred          Thoracic Spine Area Exam  Skin & Axial Inspection: No masses, redness, or swelling Alignment: Symmetrical Functional ROM: Unrestricted ROM Stability: No instability detected Muscle Tone/Strength: Functionally intact. No obvious neuro-muscular anomalies detected. Sensory (Neurological): Unimpaired Muscle strength & Tone: No palpable anomalies  Lumbar Spine Area Exam  Skin & Axial Inspection: No masses, redness, or swelling Alignment: Symmetrical Functional ROM: Unrestricted ROM      Stability: No instability detected Muscle Tone/Strength: Functionally intact. No obvious neuro-muscular anomalies detected. Sensory (Neurological): Unimpaired Palpation: No palpable anomalies       Provocative Tests: Lumbar Hyperextension and rotation test: evaluation deferred today       Lumbar Lateral bending test: evaluation deferred today       Patrick's Maneuver: evaluation deferred today                    Gait & Posture Assessment  Ambulation: Unassisted Gait: Relatively normal for age and body habitus Posture: WNL   Lower  Extremity Exam    Side: Right lower extremity  Side: Left lower extremity  Skin & Extremity Inspection: Skin color, temperature, and hair growth are WNL. No peripheral edema or cyanosis. No masses, redness, swelling, asymmetry, or associated skin lesions. No contractures.  Skin & Extremity Inspection: Skin color, temperature, and hair growth are WNL. No peripheral edema or cyanosis. No masses, redness, swelling, asymmetry, or associated skin lesions. No contractures.  Functional ROM: Unrestricted ROM          Functional ROM: Unrestricted ROM          Muscle Tone/Strength: Functionally intact. No obvious neuro-muscular anomalies detected.  Muscle Tone/Strength: Functionally intact. No obvious neuro-muscular anomalies detected.  Sensory (Neurological): Unimpaired  Sensory (Neurological): Unimpaired  Palpation: No palpable anomalies  Palpation: No palpable anomalies  Assessment & Plan  Primary Diagnosis & Pertinent Problem List: The primary encounter diagnosis was Chronic pain syndrome. 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), Chronic low back pain (Secondary Area of Pain) (Bilateral) (L>R), Chronic sacroiliac joint pain (Bilateral) (L>R), Lumbar facet arthropathy (Bilateral), Lumbar facet syndrome (Bilateral) (L>R), Lumbar facet osteoarthritis (Bilateral), Chronic lower extremity pain (Tertiary Area of Pain) (Bilateral) (L>R), Chronic pain of knees (Fourth Area of Pain) (Bilateral) (L>R), Chronic shoulder pain (Fifth Area of Pain) (Left), Rotator cuff Tendinitis (Left), Upper biceps tendon Tendonitis (Left), History of cervical spinal surgery, Long term current use of opiate analgesic, Vitamin D deficiency, Elevated C-reactive protein (CRP), Elevated sed rate, and History of marijuana use were also pertinent to this  visit.  Visit Diagnosis: 1. Chronic pain syndrome   2. Chronic neck pain (Primary Area of Pain) (Bilateral) (L>R)   3. Cervical facet syndrome (Bilateral) (L>R)   4. Cervical Spondylosis   5. Cervical foraminal stenosis (Right: C5-6) (Left: C6-7)   6. 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 Chronic low back pain (Secondary Area of Pain) (Bilateral) (L>R)   8. Chronic sacroiliac joint pain (Bilateral) (L>R)   9. Lumbar facet arthropathy (Bilateral)   10. Lumbar facet syndrome (Bilateral) (L>R)   11. Lumbar facet osteoarthritis (Bilateral)   12. Chronic lower extremity pain (Tertiary Area of Pain) (Bilateral) (L>R)   13. Chronic pain of knees (Fourth Area of Pain) (Bilateral) (L>R)   14. Chronic shoulder pain (Fifth Area of Pain) (Left)   15. Rotator cuff Tendinitis (Left)   16. Upper biceps tendon Tendonitis (Left)   17. History of cervical spinal surgery   18. Long term current use of opiate analgesic   19. Vitamin D deficiency   20. Elevated C-reactive protein (CRP)   21. Elevated sed rate   22. 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, was spent in counseling/coordination of care regarding: the appropriate use of the pain scale, "Drug Holidays", [name] primary cause of pain, the results of his recent  test(s), the significance of each one oth the test(s) anomalies and it's corresponding characteristic pain pattern(s), the treatment plan, treatment alternatives, medication side effects, the opioid analgesic risks and possible complications, realistic expectations, the goals of pain management (increased in functionality) and the need to collect and read the AVS material. Plan of Care  Pharmacotherapy (Medications Ordered): No orders of the defined types were placed in this encounter.  Procedure Orders    No procedure(s) ordered today   Lab Orders  No laboratory test(s) ordered today   Imaging Orders  No imaging studies ordered today   Referral Orders  No referral(s) requested today    Pharmacological management options:  Opioid Analgesics: I will not be prescribing any  opioids at this time.  Membrane stabilizer: None prescribed at this time Muscle relaxant: None prescribed at this time NSAID: None prescribed at this time Other analgesic(s): None prescribed at this time   Interventional management options: Planned, scheduled, and/or pending:    Today I offered to start his treatment with a diagnostic bilateral cervical facet block under fluoroscopic guidance and IV sedation.  The patient indicated that he is not interested.  He indicated that he "is not a needle man".   Considering:   Diagnostic bilateral cervical facet block  Possible bilateral cervical facet RFA   Diagnostic Left CESI  Diagnostic bilateral lumbar facet block  Possible bilateral lumbar facet RFA  Diagnostic bilateral sacroiliac joint injection  Possible bilateral SI RFA  Diagnostic left genicular nerve block  Possible left genicular nerve RFA  Diagnostic right-sided intra-articular knee injection  Possible series of 5 right-sided IA Hyalgan Knee injections  Diagnostic left suprascapular nerve block  Possible left-sided suprascapular RFA    PRN Procedures:   None at this time   Provider-requested  follow-up: No Follow-up on file.  No future appointments.  Primary Care Physician: Jodi Marble, MD Location: [facility] Note by: Gaspar Cola, MD Date: 07/23/2017; Time: 9:31 AM

## 2017-07-23 ENCOUNTER — Ambulatory Visit: Payer: Medicaid Other | Attending: Pain Medicine | Admitting: Pain Medicine

## 2017-07-23 ENCOUNTER — Encounter: Payer: Self-pay | Admitting: Pain Medicine

## 2017-07-23 ENCOUNTER — Other Ambulatory Visit: Payer: Self-pay

## 2017-07-23 VITALS — BP 138/64 | HR 79 | Temp 98.3°F | Resp 18 | Ht 76.0 in | Wt 255.0 lb

## 2017-07-23 DIAGNOSIS — Z87898 Personal history of other specified conditions: Secondary | ICD-10-CM

## 2017-07-23 DIAGNOSIS — M542 Cervicalgia: Secondary | ICD-10-CM | POA: Diagnosis not present

## 2017-07-23 DIAGNOSIS — M79604 Pain in right leg: Secondary | ICD-10-CM | POA: Diagnosis not present

## 2017-07-23 DIAGNOSIS — G894 Chronic pain syndrome: Secondary | ICD-10-CM

## 2017-07-23 DIAGNOSIS — F1211 Cannabis abuse, in remission: Secondary | ICD-10-CM | POA: Diagnosis not present

## 2017-07-23 DIAGNOSIS — E559 Vitamin D deficiency, unspecified: Secondary | ICD-10-CM | POA: Diagnosis not present

## 2017-07-23 DIAGNOSIS — M79605 Pain in left leg: Secondary | ICD-10-CM | POA: Diagnosis not present

## 2017-07-23 DIAGNOSIS — Z79899 Other long term (current) drug therapy: Secondary | ICD-10-CM | POA: Insufficient documentation

## 2017-07-23 DIAGNOSIS — M7582 Other shoulder lesions, left shoulder: Secondary | ICD-10-CM

## 2017-07-23 DIAGNOSIS — G4730 Sleep apnea, unspecified: Secondary | ICD-10-CM | POA: Insufficient documentation

## 2017-07-23 DIAGNOSIS — M533 Sacrococcygeal disorders, not elsewhere classified: Secondary | ICD-10-CM | POA: Diagnosis not present

## 2017-07-23 DIAGNOSIS — F1291 Cannabis use, unspecified, in remission: Secondary | ICD-10-CM

## 2017-07-23 DIAGNOSIS — M9981 Other biomechanical lesions of cervical region: Secondary | ICD-10-CM

## 2017-07-23 DIAGNOSIS — M5023 Other cervical disc displacement, cervicothoracic region: Secondary | ICD-10-CM | POA: Insufficient documentation

## 2017-07-23 DIAGNOSIS — M25561 Pain in right knee: Secondary | ICD-10-CM

## 2017-07-23 DIAGNOSIS — F329 Major depressive disorder, single episode, unspecified: Secondary | ICD-10-CM | POA: Insufficient documentation

## 2017-07-23 DIAGNOSIS — M47812 Spondylosis without myelopathy or radiculopathy, cervical region: Secondary | ICD-10-CM | POA: Diagnosis not present

## 2017-07-23 DIAGNOSIS — E785 Hyperlipidemia, unspecified: Secondary | ICD-10-CM | POA: Insufficient documentation

## 2017-07-23 DIAGNOSIS — R7982 Elevated C-reactive protein (CRP): Secondary | ICD-10-CM | POA: Diagnosis not present

## 2017-07-23 DIAGNOSIS — F17210 Nicotine dependence, cigarettes, uncomplicated: Secondary | ICD-10-CM | POA: Diagnosis not present

## 2017-07-23 DIAGNOSIS — M47816 Spondylosis without myelopathy or radiculopathy, lumbar region: Secondary | ICD-10-CM | POA: Diagnosis not present

## 2017-07-23 DIAGNOSIS — M170 Bilateral primary osteoarthritis of knee: Secondary | ICD-10-CM | POA: Diagnosis not present

## 2017-07-23 DIAGNOSIS — M4712 Other spondylosis with myelopathy, cervical region: Secondary | ICD-10-CM | POA: Diagnosis not present

## 2017-07-23 DIAGNOSIS — M546 Pain in thoracic spine: Secondary | ICD-10-CM | POA: Diagnosis present

## 2017-07-23 DIAGNOSIS — M545 Low back pain, unspecified: Secondary | ICD-10-CM

## 2017-07-23 DIAGNOSIS — G8929 Other chronic pain: Secondary | ICD-10-CM

## 2017-07-23 DIAGNOSIS — M779 Enthesopathy, unspecified: Secondary | ICD-10-CM | POA: Diagnosis not present

## 2017-07-23 DIAGNOSIS — M7522 Bicipital tendinitis, left shoulder: Secondary | ICD-10-CM

## 2017-07-23 DIAGNOSIS — Z981 Arthrodesis status: Secondary | ICD-10-CM | POA: Insufficient documentation

## 2017-07-23 DIAGNOSIS — Z79891 Long term (current) use of opiate analgesic: Secondary | ICD-10-CM | POA: Diagnosis not present

## 2017-07-23 DIAGNOSIS — M25562 Pain in left knee: Secondary | ICD-10-CM

## 2017-07-23 DIAGNOSIS — M25512 Pain in left shoulder: Secondary | ICD-10-CM | POA: Diagnosis not present

## 2017-07-23 DIAGNOSIS — Z96652 Presence of left artificial knee joint: Secondary | ICD-10-CM | POA: Diagnosis not present

## 2017-07-23 DIAGNOSIS — M4802 Spinal stenosis, cervical region: Secondary | ICD-10-CM

## 2017-07-23 DIAGNOSIS — R700 Elevated erythrocyte sedimentation rate: Secondary | ICD-10-CM

## 2017-07-23 DIAGNOSIS — Z9889 Other specified postprocedural states: Secondary | ICD-10-CM

## 2017-07-23 DIAGNOSIS — I10 Essential (primary) hypertension: Secondary | ICD-10-CM | POA: Insufficient documentation

## 2017-07-23 LAB — VITAMIN B12: Vitamin B-12: 384 pg/mL (ref 232–1245)

## 2017-07-23 LAB — MAGNESIUM: Magnesium: 2 mg/dL (ref 1.6–2.3)

## 2017-07-23 LAB — COMP. METABOLIC PANEL (12)
AST: 20 IU/L (ref 0–40)
Albumin/Globulin Ratio: 1.3 (ref 1.2–2.2)
Albumin: 4.3 g/dL (ref 3.6–4.8)
Alkaline Phosphatase: 114 IU/L (ref 39–117)
BUN / CREAT RATIO: 13 (ref 10–24)
BUN: 14 mg/dL (ref 8–27)
Bilirubin Total: 0.3 mg/dL (ref 0.0–1.2)
CALCIUM: 9.5 mg/dL (ref 8.6–10.2)
CREATININE: 1.04 mg/dL (ref 0.76–1.27)
Chloride: 103 mmol/L (ref 96–106)
GFR, EST AFRICAN AMERICAN: 89 mL/min/{1.73_m2} (ref >59)
GFR, EST NON AFRICAN AMERICAN: 77 mL/min/{1.73_m2} (ref >59)
Globulin, Total: 3.4 g/dL (ref 1.5–4.5)
Glucose: 116 mg/dL — ABNORMAL HIGH (ref 65–99)
Potassium: 4.1 mmol/L (ref 3.5–5.2)
Sodium: 141 mmol/L (ref 134–144)
TOTAL PROTEIN: 7.7 g/dL (ref 6.0–8.5)

## 2017-07-23 LAB — C-REACTIVE PROTEIN: CRP: 5.2 mg/L — AB (ref 0.0–4.9)

## 2017-07-23 LAB — 25-HYDROXY VITAMIN D LCMS D2+D3: 25-Hydroxy, Vitamin D: 15 ng/mL — ABNORMAL LOW

## 2017-07-23 LAB — SEDIMENTATION RATE: Sed Rate: 44 mm/hr — ABNORMAL HIGH (ref 0–30)

## 2017-07-23 LAB — 25-HYDROXYVITAMIN D LCMS D2+D3: 25-HYDROXY, VITAMIN D-3: 15 ng/mL

## 2017-07-23 NOTE — Progress Notes (Signed)
Safety precautions to be maintained throughout the outpatient stay will include: orient to surroundings, keep bed in low position, maintain call bell within reach at all times, provide assistance with transfer out of bed and ambulation.  

## 2017-08-07 ENCOUNTER — Telehealth: Payer: Self-pay | Admitting: Pain Medicine

## 2017-08-07 NOTE — Telephone Encounter (Signed)
Patient has thought about options for treatment and is willing to go forward with procedures. Please discuss with Dr. Laban Emperor so we can get some orders and schedule patient. I did inform patient that Dr. Dorris Carnes was not in until next week

## 2017-08-09 NOTE — Telephone Encounter (Signed)
Patient has medicaid, no prior auth reqd

## 2017-08-09 NOTE — Telephone Encounter (Signed)
After speaking with Crystal, in the last encounter with Dr Laban Emperor, the note states that patient was not interested in interventional therapy, "he was not a needle man".  Therefore there would no additional appts scheduled.    Will wait until Dr Laban Emperor is in the office to clarify if scheduling this patient is appropriate.

## 2017-08-10 LAB — SPECIMEN STATUS REPORT

## 2017-08-10 LAB — RHEUMATOID FACTOR

## 2017-08-10 LAB — ANA W/REFLEX IF POSITIVE: ANA: NEGATIVE

## 2017-08-14 ENCOUNTER — Ambulatory Visit: Payer: Medicaid Other | Attending: Pain Medicine | Admitting: Pain Medicine

## 2017-08-14 ENCOUNTER — Other Ambulatory Visit: Payer: Self-pay | Admitting: Nurse Practitioner

## 2017-08-14 DIAGNOSIS — M4802 Spinal stenosis, cervical region: Secondary | ICD-10-CM

## 2017-08-14 DIAGNOSIS — M47812 Spondylosis without myelopathy or radiculopathy, cervical region: Secondary | ICD-10-CM

## 2017-08-14 NOTE — Telephone Encounter (Signed)
Patient has decided to have the procedure. Will you please put the order in?

## 2017-08-14 NOTE — Telephone Encounter (Signed)
done

## 2017-08-14 NOTE — Telephone Encounter (Signed)
The order is in

## 2017-08-21 ENCOUNTER — Encounter: Payer: Self-pay | Admitting: Pain Medicine

## 2017-08-21 ENCOUNTER — Ambulatory Visit
Admission: RE | Admit: 2017-08-21 | Discharge: 2017-08-21 | Disposition: A | Discharge status: Home / Self Care | Payer: Medicaid Other | Source: Ambulatory Visit | Attending: Pain Medicine | Admitting: Pain Medicine

## 2017-08-21 ENCOUNTER — Ambulatory Visit (HOSPITAL_BASED_OUTPATIENT_CLINIC_OR_DEPARTMENT_OTHER): Payer: Medicaid Other | Admitting: Pain Medicine

## 2017-08-21 VITALS — BP 145/72 | HR 62 | Temp 97.5°F | Resp 18 | Ht 76.0 in | Wt 255.0 lb

## 2017-08-21 DIAGNOSIS — Z880 Allergy status to penicillin: Secondary | ICD-10-CM | POA: Insufficient documentation

## 2017-08-21 DIAGNOSIS — M549 Dorsalgia, unspecified: Secondary | ICD-10-CM | POA: Diagnosis not present

## 2017-08-21 DIAGNOSIS — G8929 Other chronic pain: Secondary | ICD-10-CM

## 2017-08-21 DIAGNOSIS — M542 Cervicalgia: Secondary | ICD-10-CM

## 2017-08-21 DIAGNOSIS — Z9889 Other specified postprocedural states: Secondary | ICD-10-CM | POA: Insufficient documentation

## 2017-08-21 DIAGNOSIS — M47812 Spondylosis without myelopathy or radiculopathy, cervical region: Secondary | ICD-10-CM

## 2017-08-21 DIAGNOSIS — Z888 Allergy status to other drugs, medicaments and biological substances status: Secondary | ICD-10-CM | POA: Diagnosis not present

## 2017-08-21 MED ORDER — FENTANYL CITRATE (PF) 100 MCG/2ML IJ SOLN
25.0000 ug | INTRAMUSCULAR | Status: DC | PRN
  Administered 2017-08-21: 100 ug via INTRAVENOUS
  Filled 2017-08-21: qty 2

## 2017-08-21 MED ORDER — LIDOCAINE HCL 2 % IJ SOLN
20.0000 mL | Freq: Once | INTRAMUSCULAR | Status: AC
  Administered 2017-08-21: 400 mg
  Filled 2017-08-21: qty 40

## 2017-08-21 MED ORDER — ROPIVACAINE HCL 2 MG/ML IJ SOLN
9.0000 mL | Freq: Once | INTRAMUSCULAR | Status: AC
  Administered 2017-08-21: 9 mL via PERINEURAL
  Filled 2017-08-21: qty 10

## 2017-08-21 MED ORDER — MIDAZOLAM HCL 5 MG/5ML IJ SOLN
1.0000 mg | INTRAMUSCULAR | Status: DC | PRN
  Administered 2017-08-21: 3 mg via INTRAVENOUS
  Filled 2017-08-21: qty 5

## 2017-08-21 MED ORDER — DEXAMETHASONE SODIUM PHOSPHATE 10 MG/ML IJ SOLN
10.0000 mg | Freq: Once | INTRAMUSCULAR | Status: AC
  Administered 2017-08-21: 10 mg
  Filled 2017-08-21: qty 1

## 2017-08-21 MED ORDER — ROPIVACAINE HCL 2 MG/ML IJ SOLN
9.0000 mL | Freq: Once | INTRAMUSCULAR | Status: AC
  Administered 2017-08-21: 10 mL via PERINEURAL
  Filled 2017-08-21: qty 10

## 2017-08-21 MED ORDER — LACTATED RINGERS IV SOLN
1000.0000 mL | Freq: Once | INTRAVENOUS | Status: AC
Start: 2017-08-21 — End: 2017-08-21
  Administered 2017-08-21: 1000 mL via INTRAVENOUS

## 2017-08-21 NOTE — Patient Instructions (Addendum)

## 2017-08-21 NOTE — Progress Notes (Signed)
Patient's Name: Gabriel Russell  MRN: [id number]  Referring Provider: Sherron Monday, MD  DOB: 13-Sep-1955  PCP: Sherron Monday, MD  DOS: 08/21/2017  Note by: Oswaldo Done, MD  Service setting: Ambulatory outpatient  Specialty: Interventional Pain Management  Patient type: Established  Location: [facility] (AMB) Pain Management Facility  Visit type: Interventional Procedure   Primary Reason for Visit: Interventional Pain Management Treatment. CC: Neck Pain; Leg Pain; and Back Pain (radiating down both legs)  Procedure:       Anesthesia, Analgesia, Anxiolysis:  Type: Cervical Facet Medial Branch Block(s) #1  Primary Purpose: Diagnostic Region: Posterolateral cervical spine Level: C3, C4, C5, C6, & C7 Medial Branch Level(s). Injecting these levels blocks the C3-4, C4-5, C5-6, and C6-7 cervical facet joints. Laterality: Bilateral Paraspinal  Type: Moderate (Conscious) Sedation combined with Local Anesthesia Indication(s): Analgesia and Anxiety Route: Intravenous (IV) IV Access: Secured Sedation: Meaningful verbal contact was maintained at all times during the procedure  Local Anesthetic: Lidocaine 1-2%   Indications: 1. Spondylosis without myelopathy or radiculopathy, cervical region   2. 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 Chronic neck pain (Primary Area of Pain) (Bilateral) (L>R)   4. Cervicalgia    Pain Score: Pre-procedure: 7 /10 Post-procedure: 0-No pain/10  Pre-op Assessment:  Gabriel Russell is a 62 y.o. (year old), male patient, seen today for interventional treatment. He  has a past surgical history that includes Anterior cervical decompression/discectomy fusion 4 level (N/A, 09/04/2013); Joint replacement (Left); Brain surgery; Shoulder arthroscopy with open rotator cuff repair (Left, 05/11/2016); Shoulder arthroscopy with subacromial decompression (Left, 05/11/2016); and Shoulder arthroscopy with debridement and bicep tendon repair (Left, 05/11/2016). Gabriel Russell has a  current medication list which includes the following prescription(s): amlodipine-valsartan, atorvastatin, chlorthalidone, cyclobenzaprine, dutasteride, naproxen, pantoprazole, sildenafil, and tamsulosin, and the following Facility-Administered Medications: fentanyl, lactated ringers, and midazolam. His primarily concern today is the Neck Pain; Leg Pain; and Back Pain (radiating down both legs)  Initial Vital Signs:  Pulse Rate: 62 Temp: 97.8 F (36.6 C) Resp: 18 BP: 128/80 SpO2: 99 %  BMI: Estimated body mass index is 31.04 kg/m as calculated from the following:   Height as of this encounter: 6\' 4"  (1.93 m).   Weight as of this encounter: 255 lb (115.7 kg).  Risk Assessment: Allergies: Reviewed. He is allergic to amoxicillin and lisinopril.  Allergy Precautions: None required Coagulopathies: Reviewed. None identified.  Blood-thinner therapy: None at this time Active Infection(s): Reviewed. None identified. Gabriel Russell is afebrile  Site Confirmation: Gabriel Russell was asked to confirm the procedure and laterality before marking the site Procedure checklist: Completed Consent: Before the procedure and under the influence of no sedative(s), amnesic(s), or anxiolytics, the patient was informed of the treatment options, risks and possible complications. To fulfill our ethical and legal obligations, as recommended by the American Medical Association's Code of Ethics, I have informed the patient of my clinical impression; the nature and purpose of the treatment or procedure; the risks, benefits, and possible complications of the intervention; the alternatives, including doing nothing; the risk(s) and benefit(s) of the alternative treatment(s) or procedure(s); and the risk(s) and benefit(s) of doing nothing. The patient was provided information about the general risks and possible complications associated with the procedure. These may include, but are not limited to: failure to achieve desired goals,  infection, bleeding, organ or nerve damage, allergic reactions, paralysis, and death. In addition, the patient was informed of those risks and complications associated to Spine-related procedures, such as failure to  decrease pain; infection (i.e.: Meningitis, epidural or intraspinal abscess); bleeding (i.e.: epidural hematoma, subarachnoid hemorrhage, or any other type of intraspinal or peri-dural bleeding); organ or nerve damage (i.e.: Any type of peripheral nerve, nerve root, or spinal cord injury) with subsequent damage to sensory, motor, and/or autonomic systems, resulting in permanent pain, numbness, and/or weakness of one or several areas of the body; allergic reactions; (i.e.: anaphylactic reaction); and/or death. Furthermore, the patient was informed of those risks and complications associated with the medications. These include, but are not limited to: allergic reactions (i.e.: anaphylactic or anaphylactoid reaction(s)); adrenal axis suppression; blood sugar elevation that in diabetics may result in ketoacidosis or comma; water retention that in patients with history of congestive heart failure may result in shortness of breath, pulmonary edema, and decompensation with resultant heart failure; weight gain; swelling or edema; medication-induced neural toxicity; particulate matter embolism and blood vessel occlusion with resultant organ, and/or nervous system infarction; and/or aseptic necrosis of one or more joints. Finally, the patient was informed that Medicine is not an exact science; therefore, there is also the possibility of unforeseen or unpredictable risks and/or possible complications that may result in a catastrophic outcome. The patient indicated having understood very clearly. We have given the patient no guarantees and we have made no promises. Enough time was given to the patient to ask questions, all of which were answered to the patient's satisfaction. Gabriel Russell has indicated that he  wanted to continue with the procedure. Attestation: I, the ordering provider, attest that I have discussed with the patient the benefits, risks, side-effects, alternatives, likelihood of achieving goals, and potential problems during recovery for the procedure that I have provided informed consent. Date  Time: 08/21/2017  8:47 AM  Pre-Procedure Preparation:  Monitoring: As per clinic protocol. Respiration, ETCO2, SpO2, BP, heart rate and rhythm monitor placed and checked for adequate function Safety Precautions: Patient was assessed for positional comfort and pressure points before starting the procedure. Time-out: I initiated and conducted the "Time-out" before starting the procedure, as per protocol. The patient was asked to participate by confirming the accuracy of the "Time Out" information. Verification of the correct person, site, and procedure were performed and confirmed by me, the nursing staff, and the patient. "Time-out" conducted as per Joint Commission's Universal Protocol (UP.01.01.01). Time: 0950  Description of Procedure:       Position: Prone with head of the table raised to facilitate breathing. Laterality: Bilateral. The procedure was performed in identical fashion on both sides. Level: C3, C4, C5, C6, & C7 Medial Branch Level(s). Area Prepped: Posterior Cervico-thoracic Region Prepping solution: ChloraPrep (2% chlorhexidine gluconate and 70% isopropyl alcohol) Safety Precautions: Aspiration looking for blood return was conducted prior to all injections. At no point did we inject any substances, as a needle was being advanced. Before injecting, the patient was told to immediately notify me if he was experiencing any new onset of "ringing in the ears, or metallic taste in the mouth". No attempts were made at seeking any paresthesias. Safe injection practices and needle disposal techniques used. Medications properly checked for expiration dates. SDV (single dose vial) medications  used. After the completion of the procedure, all disposable equipment used was discarded in the proper designated medical waste containers. Local Anesthesia: Protocol guidelines were followed. The patient was positioned over the fluoroscopy table. The area was prepped in the usual manner. The time-out was completed. The target area was identified using fluoroscopy. A 12-in long, straight, sterile hemostat was used with fluoroscopic  guidance to locate the targets for each level blocked. Once located, the skin was marked with an approved surgical skin marker. Once all sites were marked, the skin (epidermis, dermis, and hypodermis), as well as deeper tissues (fat, connective tissue and muscle) were infiltrated with a small amount of a short-acting local anesthetic, loaded on a 10cc syringe with a 25G, 1.5-in  Needle. An appropriate amount of time was allowed for local anesthetics to take effect before proceeding to the next step. Local Anesthetic: Lidocaine 2.0% The unused portion of the local anesthetic was discarded in the proper designated containers. Technical explanation of process:  C3 Medial Branch Nerve Block (MBB): The target area for the C3 dorsal medial articular branch is the lateral concave waist of the articular pillar of C3. Under fluoroscopic guidance, a Quincke needle was inserted until contact was made with os over the postero-lateral aspect of the articular pillar of C3 (target area). After negative aspiration for blood, 0.5 mL of the nerve block solution was injected without difficulty or complication. The needle was removed intact. C4 Medial Branch Nerve Block (MBB): The target area for the C4 dorsal medial articular branch is the lateral concave waist of the articular pillar of C4. Under fluoroscopic guidance, a Quincke needle was inserted until contact was made with os over the postero-lateral aspect of the articular pillar of C4 (target area). After negative aspiration for blood, 0.5 mL of  the nerve block solution was injected without difficulty or complication. The needle was removed intact. C5 Medial Branch Nerve Block (MBB): The target area for the C5 dorsal medial articular branch is the lateral concave waist of the articular pillar of C5. Under fluoroscopic guidance, a Quincke needle was inserted until contact was made with os over the postero-lateral aspect of the articular pillar of C5 (target area). After negative aspiration for blood, 0.5 mL of the nerve block solution was injected without difficulty or complication. The needle was removed intact. C6 Medial Branch Nerve Block (MBB): The target area for the C6 dorsal medial articular branch is the lateral concave waist of the articular pillar of C6. Under fluoroscopic guidance, a Quincke needle was inserted until contact was made with os over the postero-lateral aspect of the articular pillar of C6 (target area). After negative aspiration for blood, 0.5 mL of the nerve block solution was injected without difficulty or complication. The needle was removed intact. C7 Medial Branch Nerve Block (MBB): The target for the C7 dorsal medial articular branch lies on the superior-medial tip of the C7 transverse process. Under fluoroscopic guidance, a Quincke needle was inserted until contact was made with os over the postero-lateral aspect of the articular pillar of C7 (target area). After negative aspiration for blood, 0.5 mL of the nerve block solution was injected without difficulty or complication. The needle was removed intact. Procedural Needles: 22-gauge, 3.5-inch, Quincke needles used for all levels. Nerve block solution: 0.2% PF-Ropivacaine + Triamcinolone (40 mg/mL) diluted to a final concentration of 4 mg of Triamcinolone/mL of Ropivacaine The unused portion of the solution was discarded in the proper designated containers.  Once the entire procedure was completed, the treated area was cleaned, making sure to leave some of the prepping  solution back to take advantage of its long term bactericidal properties.  Vitals:   08/21/17 1008 08/21/17 1014 08/21/17 1024 08/21/17 1035  BP: 119/79 131/87 (!) 146/75 (!) 155/67  Pulse:      Resp: 19 15 (!) 29 17  Temp:   (!) 97.5  F (36.4 C)   SpO2: 97% 97% 99% 100%  Weight:      Height:        Start Time: 0950 hrs. End Time: 1013 hrs.  Imaging Guidance (Spinal):  Type of Imaging Technique: Fluoroscopy Guidance (Spinal) Indication(s): Assistance in needle guidance and placement for procedures requiring needle placement in or near specific anatomical locations not easily accessible without such assistance. Exposure Time: Please see nurses notes. Contrast: None used. Fluoroscopic Guidance: I was personally present during the use of fluoroscopy. "Tunnel Vision Technique" used to obtain the best possible view of the target area. Parallax error corrected before commencing the procedure. "Direction-depth-direction" technique used to introduce the needle under continuous pulsed fluoroscopy. Once target was reached, antero-posterior, oblique, and lateral fluoroscopic projection used confirm needle placement in all planes. Images permanently stored in EMR. Interpretation: No contrast injected. I personally interpreted the imaging intraoperatively. Adequate needle placement confirmed in multiple planes. Permanent images saved into the patient's record.  Antibiotic Prophylaxis:   Anti-infectives (From admission, onward)   None     Indication(s): None identified  Post-operative Assessment:  Post-procedure Vital Signs:  Pulse Rate: 62 Temp: (!) 97.5 F (36.4 C) Resp: 17 BP: (!) 155/67 SpO2: 100 %  EBL: None  Complications: No immediate post-treatment complications observed by team, or reported by patient.  Note: The patient tolerated the entire procedure well. A repeat set of vitals were taken after the procedure and the patient was kept under observation following institutional  policy, for this type of procedure. Post-procedural neurological assessment was performed, showing return to baseline, prior to discharge. The patient was provided with post-procedure discharge instructions, including a section on how to identify potential problems. Should any problems arise concerning this procedure, the patient was given instructions to immediately contact us, at any time, without hesitation. In any case, we plan to contact the patient by telephone for a follow-up status report regarding this interventional procedure.  Comments:  No additional relevant information.  Plan of Care    Imaging Orders     DG C-Arm 1-60 Min-No Report  Procedure Orders     CERVICAL FACET (MEDIAL BRANCH NERVE BLOCK)   Medications ordered for procedure: Meds ordered this encounter  Medications  . lidocaine (XYLOCAINE) 2 % (with pres) injection 400 mg  . midazolam (VERSED) 5 MG/5ML injection 1-2 mg    Make sure Flumazenil is available in the pyxis when using this medication. If oversedation occurs, administer 0.2 mg IV over 15 sec. If after 45 sec no response, administer 0.2 mg again over 1 min; may repeat at 1 min intervals; not to exceed 4 doses (1 mg)  . fentaNYL (SUBLIMAZE) injection 25-50 mcg    Make sure Narcan is available in the pyxis when using this medication. In the event of respiratory depression (RR< 8/min): Titrate NARCAN (naloxone) in increments of 0.1 to 0.2 mg IV at 2-3 minute intervals, until desired degree of reversal.  . lactated ringers infusion 1,000 mL  . ropivacaine (PF) 2 mg/mL (0.2%) (NAROPIN) injection 9 mL  . dexamethasone (DECADRON) injection 10 mg  . dexamethasone (DECADRON) injection 10 mg  . ropivacaine (PF) 2 mg/mL (0.2%) (NAROPIN) injection 9 mL   Medications administered: We administered lidocaine, midazolam, fentaNYL, lactated ringers, ropivacaine (PF) 2 mg/mL (0.2%), dexamethasone, dexamethasone, and ropivacaine (PF) 2 mg/mL (0.2%).  See the medical record  for exact dosing, route, and time of administration.  New Prescriptions   No medications on file   Disposition: Discharge home  Discharge  Date & Time: 08/21/2017; 1045 hrs.   Physician-requested Follow-up: Return for post-procedure eval (2 wks), w/ Dr. Laban Emperor.  No future appointments. Primary Care Physician: Sherron Monday, MD Location: [facility] Note by: Oswaldo Done, MD Date: 08/21/2017; Time: 10:39 AM  Disclaimer:  Medicine is not an exact science. The only guarantee in medicine is that nothing is guaranteed. It is important to note that the decision to proceed with this intervention was based on the information collected from the patient. The Data and conclusions were drawn from the patient's questionnaire, the interview, and the physical examination. Because the information was provided in large part by the patient, it cannot be guaranteed that it has not been purposely or unconsciously manipulated. Every effort has been made to obtain as much relevant data as possible for this evaluation. It is important to note that the conclusions that lead to this procedure are derived in large part from the available data. Always take into account that the treatment will also be dependent on availability of resources and existing treatment guidelines, considered by other Pain Management Practitioners as being common knowledge and practice, at the time of the intervention. For Medico-Legal purposes, it is also important to point out that variation in procedural techniques and pharmacological choices are the acceptable norm. The indications, contraindications, technique, and results of the above procedure should only be interpreted and judged by a Board-Certified Interventional Pain Specialist with extensive familiarity and expertise in the same exact procedure and technique.

## 2017-08-22 ENCOUNTER — Telehealth: Payer: Self-pay

## 2017-08-22 NOTE — Telephone Encounter (Signed)
Post procedure phone call. Patient states he is doing well.  

## 2017-09-12 ENCOUNTER — Other Ambulatory Visit: Payer: Self-pay

## 2017-09-12 ENCOUNTER — Ambulatory Visit: Payer: Medicaid Other | Attending: Pain Medicine | Admitting: Pain Medicine

## 2017-09-12 ENCOUNTER — Encounter: Payer: Self-pay | Admitting: Pain Medicine

## 2017-09-12 VITALS — BP 122/79 | HR 74 | Temp 97.9°F | Resp 18 | Ht 76.0 in | Wt 260.0 lb

## 2017-09-12 DIAGNOSIS — M47812 Spondylosis without myelopathy or radiculopathy, cervical region: Secondary | ICD-10-CM | POA: Diagnosis not present

## 2017-09-12 DIAGNOSIS — I10 Essential (primary) hypertension: Secondary | ICD-10-CM | POA: Diagnosis not present

## 2017-09-12 DIAGNOSIS — Z888 Allergy status to other drugs, medicaments and biological substances status: Secondary | ICD-10-CM | POA: Diagnosis not present

## 2017-09-12 DIAGNOSIS — Z9889 Other specified postprocedural states: Secondary | ICD-10-CM | POA: Insufficient documentation

## 2017-09-12 DIAGNOSIS — G8929 Other chronic pain: Secondary | ICD-10-CM | POA: Insufficient documentation

## 2017-09-12 DIAGNOSIS — G4730 Sleep apnea, unspecified: Secondary | ICD-10-CM | POA: Insufficient documentation

## 2017-09-12 DIAGNOSIS — M79606 Pain in leg, unspecified: Secondary | ICD-10-CM | POA: Insufficient documentation

## 2017-09-12 DIAGNOSIS — Z791 Long term (current) use of non-steroidal anti-inflammatories (NSAID): Secondary | ICD-10-CM | POA: Insufficient documentation

## 2017-09-12 DIAGNOSIS — M545 Low back pain: Secondary | ICD-10-CM | POA: Insufficient documentation

## 2017-09-12 DIAGNOSIS — F17210 Nicotine dependence, cigarettes, uncomplicated: Secondary | ICD-10-CM | POA: Insufficient documentation

## 2017-09-12 DIAGNOSIS — M4322 Fusion of spine, cervical region: Secondary | ICD-10-CM | POA: Insufficient documentation

## 2017-09-12 DIAGNOSIS — E559 Vitamin D deficiency, unspecified: Secondary | ICD-10-CM | POA: Insufficient documentation

## 2017-09-12 DIAGNOSIS — E785 Hyperlipidemia, unspecified: Secondary | ICD-10-CM | POA: Insufficient documentation

## 2017-09-12 DIAGNOSIS — M4802 Spinal stenosis, cervical region: Secondary | ICD-10-CM | POA: Insufficient documentation

## 2017-09-12 DIAGNOSIS — Z811 Family history of alcohol abuse and dependence: Secondary | ICD-10-CM | POA: Insufficient documentation

## 2017-09-12 DIAGNOSIS — Z880 Allergy status to penicillin: Secondary | ICD-10-CM | POA: Diagnosis not present

## 2017-09-12 DIAGNOSIS — M25562 Pain in left knee: Secondary | ICD-10-CM | POA: Diagnosis not present

## 2017-09-12 DIAGNOSIS — Z79899 Other long term (current) drug therapy: Secondary | ICD-10-CM | POA: Insufficient documentation

## 2017-09-12 DIAGNOSIS — M542 Cervicalgia: Secondary | ICD-10-CM | POA: Diagnosis not present

## 2017-09-12 DIAGNOSIS — M79604 Pain in right leg: Secondary | ICD-10-CM | POA: Diagnosis not present

## 2017-09-12 DIAGNOSIS — M792 Neuralgia and neuritis, unspecified: Secondary | ICD-10-CM | POA: Diagnosis not present

## 2017-09-12 DIAGNOSIS — K219 Gastro-esophageal reflux disease without esophagitis: Secondary | ICD-10-CM | POA: Diagnosis not present

## 2017-09-12 DIAGNOSIS — Z833 Family history of diabetes mellitus: Secondary | ICD-10-CM | POA: Insufficient documentation

## 2017-09-12 DIAGNOSIS — M9981 Other biomechanical lesions of cervical region: Secondary | ICD-10-CM | POA: Diagnosis not present

## 2017-09-12 DIAGNOSIS — M79605 Pain in left leg: Secondary | ICD-10-CM | POA: Diagnosis not present

## 2017-09-12 DIAGNOSIS — M25561 Pain in right knee: Secondary | ICD-10-CM

## 2017-09-12 DIAGNOSIS — Z8261 Family history of arthritis: Secondary | ICD-10-CM | POA: Diagnosis not present

## 2017-09-12 MED ORDER — GABAPENTIN 100 MG PO CAPS: 100.0000 mg | ORAL_CAPSULE | Freq: Every day | ORAL | 0 refills | Status: DC

## 2017-09-12 MED ORDER — ERGOCALCIFEROL 1.25 MG (50000 UT) PO CAPS: 50000.0000 [IU] | ORAL_CAPSULE | ORAL | 0 refills | Status: AC

## 2017-09-12 MED ORDER — GNP CALCIUM 1200 1200-1000 MG-UNIT PO CHEW: 1200.0000 mg | CHEWABLE_TABLET | Freq: Every day | ORAL | 5 refills | Status: DC

## 2017-09-12 MED ORDER — MAGNESIUM 500 MG PO CAPS: 500.0000 mg | ORAL_CAPSULE | Freq: Two times a day (BID) | ORAL | 5 refills | Status: AC

## 2017-09-12 MED ORDER — VITAMIN D3 125 MCG (5000 UT) PO CAPS: 1.0000 | ORAL_CAPSULE | Freq: Every day | ORAL | 5 refills | Status: AC

## 2017-09-12 NOTE — Progress Notes (Signed)
Safety precautions to be maintained throughout the outpatient stay will include: orient to surroundings, keep bed in low position, maintain call bell within reach at all times, provide assistance with transfer out of bed and ambulation. Safety precautions to be maintained throughout the outpatient stay will include: orient to surroundings, keep bed in low position, maintain call bell within reach at all times, provide assistance with transfer out of bed and ambulation.   Spoke with patient regarding complaint that he was not getting anything to help with his pain. States he is not being cared for. Gave number of patient experience as requested.

## 2017-09-12 NOTE — Patient Instructions (Addendum)
____________________________________________________________________________________________  Pain Scale  Introduction: The pain score used by this practice is the Verbal Numerical Rating Scale (VNRS-11). This is an 11-point scale. It is for adults and children 10 years or older. There are significant differences in how the pain score is reported, used, and applied. Forget everything you learned in the past and learn this scoring system.  General Information: The scale should reflect your current level of pain. Unless you are specifically asked for the level of your worst pain, or your average pain. If you are asked for one of these two, then it should be understood that it is over the past 24 hours.  Basic Activities of Daily Living (ADL): Personal hygiene, dressing, eating, transferring, and using restroom.  Instructions: Most patients tend to report their level of pain as a combination of two factors, their physical pain and their psychosocial pain. This last one is also known as "suffering" and it is reflection of how physical pain affects you socially and psychologically. From now on, report them separately. From this point on, when asked to report your pain level, report only your physical pain. Use the following table for reference.  Pain Clinic Pain Levels (0-5/10)  Pain Level Score  Description  No Pain 0   Mild pain 1 Nagging, annoying, but does not interfere with basic activities of daily living (ADL). Patients are able to eat, bathe, get dressed, toileting (being able to get on and off the toilet and perform personal hygiene functions), transfer (move in and out of bed or a chair without assistance), and maintain continence (able to control bladder and bowel functions). Blood pressure and heart rate are unaffected. A normal heart rate for a healthy adult ranges from 60 to 100 bpm (beats per minute).   Mild to moderate pain 2 Noticeable and distracting. Impossible to hide from other  people. More frequent flare-ups. Still possible to adapt and function close to normal. It can be very annoying and may have occasional stronger flare-ups. With discipline, patients may get used to it and adapt.   Moderate pain 3 Interferes significantly with activities of daily living (ADL). It becomes difficult to feed, bathe, get dressed, get on and off the toilet or to perform personal hygiene functions. Difficult to get in and out of bed or a chair without assistance. Very distracting. With effort, it can be ignored when deeply involved in activities.   Moderately severe pain 4 Impossible to ignore for more than a few minutes. With effort, patients may still be able to manage work or participate in some social activities. Very difficult to concentrate. Signs of autonomic nervous system discharge are evident: dilated pupils (mydriasis); mild sweating (diaphoresis); sleep interference. Heart rate becomes elevated (>115 bpm). Diastolic blood pressure (lower number) rises above 100 mmHg. Patients find relief in laying down and not moving.   Severe pain 5 Intense and extremely unpleasant. Associated with frowning face and frequent crying. Pain overwhelms the senses.  Ability to do any activity or maintain social relationships becomes significantly limited. Conversation becomes difficult. Pacing back and forth is common, as getting into a comfortable position is nearly impossible. Pain wakes you up from deep sleep. Physical signs will be obvious: pupillary dilation; increased sweating; goosebumps; brisk reflexes; cold, clammy hands and feet; nausea, vomiting or dry heaves; loss of appetite; significant sleep disturbance with inability to fall asleep or to remain asleep. When persistent, significant weight loss is observed due to the complete loss of appetite and sleep deprivation.  Blood   pressure and heart rate becomes significantly elevated. Caution: If elevated blood pressure triggers a pounding headache  associated with blurred vision, then the patient should immediately seek attention at an urgent or emergency care unit, as these may be signs of an impending stroke.    Emergency Department Pain Levels (6-10/10)  Emergency Room Pain 6 Severely limiting. Requires emergency care and should not be seen or managed at an outpatient pain management facility. Communication becomes difficult and requires great effort. Assistance to reach the emergency department may be required. Facial flushing and profuse sweating along with potentially dangerous increases in heart rate and blood pressure will be evident.   Distressing pain 7 Self-care is very difficult. Assistance is required to transport, or use restroom. Assistance to reach the emergency department will be required. Tasks requiring coordination, such as bathing and getting dressed become very difficult.   Disabling pain 8 Self-care is no longer possible. At this level, pain is disabling. The individual is unable to do even the most "basic" activities such as walking, eating, bathing, dressing, transferring to a bed, or toileting. Fine motor skills are lost. It is difficult to think clearly.   Incapacitating pain 9 Pain becomes incapacitating. Thought processing is no longer possible. Difficult to remember your own name. Control of movement and coordination are lost.   The worst pain imaginable 10 At this level, most patients pass out from pain. When this level is reached, collapse of the autonomic nervous system occurs, leading to a sudden drop in blood pressure and heart rate. This in turn results in a temporary and dramatic drop in blood flow to the brain, leading to a loss of consciousness. Fainting is one of the body's self defense mechanisms. Passing out puts the brain in a calmed state and causes it to shut down for a while, in order to begin the healing process.    Summary: 1. Refer to this scale when providing us with your pain level. 2. Be  accurate and careful when reporting your pain level. This will help with your care. 3. Over-reporting your pain level will lead to loss of credibility. 4. Even a level of 1/10 means that there is pain and will be treated at our facility. 5. High, inaccurate reporting will be documented as "Symptom Exaggeration", leading to loss of credibility and suspicions of possible secondary gains such as obtaining more narcotics, or wanting to appear disabled, for fraudulent reasons. 6. Only pain levels of 5 or below will be seen at our facility. 7. Pain levels of 6 and above will be sent to the Emergency Department and the appointment cancelled. ____________________________________________________________________________________________   ____________________________________________________________________________________________  Preparing for Procedure with Sedation  Instructions: . Oral Intake: Do not eat or drink anything for at least 8 hours prior to your procedure. . Transportation: Public transportation is not allowed. Bring an adult driver. The driver must be physically present in our waiting room before any procedure can be started. . Physical Assistance: Bring an adult physically capable of assisting you, in the event you need help. This adult should keep you company at home for at least 6 hours after the procedure. . Blood Pressure Medicine: Take your blood pressure medicine with a sip of water the morning of the procedure. . Blood thinners:  . Diabetics on insulin: Notify the staff so that you can be scheduled 1st case in the morning. If your diabetes requires high dose insulin, take only  of your normal insulin dose the morning of the procedure and   notify the staff that you have done so. . Preventing infections: Shower with an antibacterial soap the morning of your procedure. . Build-up your immune system: Take 1000 mg of Vitamin C with every meal (3 times a day) the day prior to your  procedure. [facility] Antibiotics: Inform the staff if you have a condition or reason that requires you to take antibiotics before dental procedures. . Pregnancy: If you are pregnant, call and cancel the procedure. . Sickness: If you have a cold, fever, or any active infections, call and cancel the procedure. . Arrival: You must be in the facility at least 30 minutes prior to your scheduled procedure. . Children: Do not bring children with you. . Dress appropriately: Bring dark clothing that you would not mind if they get stained. . Valuables: Do not bring any jewelry or valuables.  Procedure appointments are reserved for interventional treatments only. [facility] No Prescription Refills. . No medication changes will be discussed during procedure appointments. . No disability issues will be discussed.  Remember:  Regular Business hours are:  Monday to Thursday 8:00 AM to 4:00 PM  Provider's Schedule: Delano Metz, MD:  Procedure days: Tuesday and Thursday 7:30 AM to 4:00 PM  Edward Jolly, MD:  Procedure days: Monday and Wednesday 7:30 AM to 4:00 PM ____________________________________________________________________________________________   ____________________________________________________________________________________________  Initial Gabapentin Titration  Medication used: Gabapentin (Generic Name) or Neurontin (Brand Name) 100 mg tablets/capsules  Reasons to stop increasing the dose:  Reason 1: You get good relief of symptoms, in which case there is no need to increase the daily dose any further.    Reason 2: You develop some side effects, such as sleeping all of the time, difficulty concentrating, or becoming disoriented, in which case you need to go down on the dose, to the prior level, where you were not experiencing any side effects. Stay on that dose longer, to allow more time for your body to get use it, before attempting to increase it again.   Steps: Step 1: Start by taking  1 (one) tablet at bedtime x 7 (seven) days.  Step 2: After being on 1 (one) tablet for 7 (seven) days, then increase it to 2 (two) tablets at bedtime for another 7 (seven) days.  Step 3: Next, after being on 2 (two) tablets at bedtime for 7 (seven) days, then increase it to 3 (three) tablets at bedtime, and stay on that dose until you see your doctor.  Reasons to stop increasing the dose: Reason 1: You get good relief of symptoms, in which case there is no need to increase the daily dose any further.  Reason 2: You develop some side effects, such as sleeping all of the time, difficulty concentrating, or becoming disoriented, in which case you need to go down on the dose, to the prior level, where you were not experiencing any side effects. Stay on that dose longer, to allow more time for your body to get use it, before attempting to increase it again.  Endpoint: Once you have reached the maximum dose you can tolerate without side-effects, contact your physician so as to evaluate the results of the regimen.   Questions: Feel free to contact us for any questions or problems at [phone] ____________________________________________________________________________________________

## 2017-09-12 NOTE — Progress Notes (Signed)
Safety precautions to be maintained throughout the outpatient stay will include: orient to surroundings, keep bed in low position, maintain call bell within reach at all times, provide assistance with transfer out of bed and ambulation.  

## 2017-09-12 NOTE — Progress Notes (Addendum)
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 Cervicalgia   2. 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 Chronic neck pain (Primary Area of Pain) (Bilateral) (L>R)   4. Chronic low back pain (Secondary Area of Pain) (Bilateral) (L>R)   5. Chronic lower extremity pain (Tertiary Area of Pain) (Bilateral) (L>R)   6. Chronic pain of knees (Fourth Area of Pain) (Bilateral) (L>R)   7. Vitamin D deficiency   8. Cervical foraminal stenosis (Right: C5-6) (Left: C6-7)   9. Neurogenic pain     Problems updated and reviewed during this visit: No problems updated.   Time Note: Greater than 50% of the 40 minute(s) of face-to-face time spent with Mr. Wicklund, was spent in counseling/coordination of care regarding: the appropriate use of the pain scale, Mr. Lienhard primary cause of pain, the results of his recent test(s), the treatment plan, treatment alternatives, the risks and possible complications of proposed treatment, the opioid analgesic risks and possible complications, the results, interpretation and significance of  his recent diagnostic interventional treatment(s), the appropriate use of his medications,  realistic expectations, the goals of pain management (increased in functionality), the importance of providing [place] with accurate post-procedure information and the need to collect and read the AVS material.  Plan of Care  Pharmacotherapy (Medications Ordered): Meds ordered this encounter  Medications  . ergocalciferol (VITAMIN D2) 50000 units capsule    Sig: Take 1 capsule (50,000 Units total) by mouth 2 (two) times a week. X 6 weeks.    Dispense:  12 capsule    Refill:  0    Do not add this medication to the electronic "Automatic Refill" notification system. Patient may have prescription filled one day early if pharmacy is closed on scheduled refill date.  . Cholecalciferol (VITAMIN D3) 5000 units CAPS    Sig: Take 1 capsule (5,000 Units total) by mouth daily with breakfast. Take along with calcium and magnesium.    Dispense:  30 capsule    Refill:  5    Do not place medication on "Automatic Refill".  May substitute with similar over-the-counter product.  . Magnesium 500 MG CAPS    Sig: Take 1 capsule (500 mg total) by mouth 2 (two) times daily at 8 am and 10 pm.    Dispense:  60 capsule    Refill:  5    Do not place medication on "Automatic Refill".  The patient may use similar over-the-counter product.  . Calcium Carbonate-Vit D-Min (GNP CALCIUM 1200) 1200-1000 MG-UNIT CHEW    Sig: Chew 1,200 mg by mouth daily with breakfast. Take in combination with vitamin D and magnesium.    Dispense:  30 tablet    Refill:  5    Do not place medication on "Automatic Refill".  May substitute with similar over-the-counter product.  Marland Kitchen  gabapentin (NEURONTIN) 100 MG capsule    Sig: Take 1-3 capsules (100-300 mg total) by mouth at bedtime. Follow written titration schedule.    Dispense:  90 capsule    Refill:  0    Do not place medication on "Automatic Refill". Fill one day early if pharmacy is closed on scheduled refill date.   Medications administered today: Nolon Rod had no medications  administered during this visit.   Procedure Orders     Cervical Epidural Injection Lab Orders  No laboratory test(s) ordered today   Imaging Orders  No imaging studies ordered today   Referral Orders  No referral(s) requested today   Interventional management options: Recommended:   Diagnostic Left CESI #1 under fluoro and IV sedation. Ordered:  The patient was provided with medication and recommendations to treat his vitamin D deficiency. We have also provided the patient with a prescription for gabapentin with titrating instructions. We have declined the patient's request for opioid analgesics because of inconsistencies observed on his PMP and UDS, as well as the fact that he has admitted that he has gone back to drinking heavily. NOTE: The patient indicated that he would not be returning since we are not giving him the oxycodone IR 10 mg 5 times a day that he wants.   Considering:   Diagnostic bilateral cervical facet block  Possible bilateral cervical facet RFA   Diagnostic Left CESI  Diagnostic bilateral lumbar facet block  Possible bilateral lumbar facet RFA  Diagnostic bilateral sacroiliac joint injection  Possible bilateral SI RFA  Diagnosticleft genicular nerve block  Possible left genicular nerve RFA  Diagnostic right-sided intra-articular knee injection  Possible series of 5 right-sided IA Hyalgan Knee injections  Diagnostic left suprascapular nerve block  Possible left-sided suprascapular RFA    Palliative PRN treatment(s):   None at this time   Provider-requested follow-up: Return for Procedure (w/ sedation): (L) CESI #1.  No future appointments. Primary Care Physician: Jodi Marble, MD Location: [facility] Note by: Gaspar Cola, MD Date: 09/12/2017; Time: 12:07 PM

## 2017-11-09 ENCOUNTER — Other Ambulatory Visit: Payer: Self-pay | Admitting: Pain Medicine

## 2017-11-09 DIAGNOSIS — M792 Neuralgia and neuritis, unspecified: Secondary | ICD-10-CM

## 2018-12-19 ENCOUNTER — Encounter: Payer: Self-pay | Admitting: Urology

## 2018-12-19 ENCOUNTER — Ambulatory Visit: Payer: Self-pay | Admitting: Urology

## 2020-05-12 ENCOUNTER — Ambulatory Visit: Payer: Self-pay

## 2020-05-14 ENCOUNTER — Ambulatory Visit: Payer: Self-pay

## 2021-02-16 ENCOUNTER — Other Ambulatory Visit: Payer: Self-pay

## 2021-02-16 ENCOUNTER — Encounter: Payer: Self-pay | Admitting: Urology

## 2021-02-16 ENCOUNTER — Ambulatory Visit (INDEPENDENT_AMBULATORY_CARE_PROVIDER_SITE_OTHER): Payer: Medicare HMO | Admitting: Urology

## 2021-02-16 VITALS — BP 90/59 | HR 103 | Ht 76.0 in | Wt 255.0 lb

## 2021-02-16 DIAGNOSIS — N5201 Erectile dysfunction due to arterial insufficiency: Secondary | ICD-10-CM | POA: Diagnosis not present

## 2021-02-16 DIAGNOSIS — N401 Enlarged prostate with lower urinary tract symptoms: Secondary | ICD-10-CM | POA: Diagnosis not present

## 2021-02-16 MED ORDER — SILDENAFIL CITRATE 100 MG PO TABS: ORAL_TABLET | ORAL | 0 refills | Status: AC

## 2021-02-16 NOTE — Progress Notes (Signed)
02/16/2021 2:06 PM   Gabriel Russell [birth year] [id number]  Referring provider: [facility] [address] [place],  [place] [address]  Chief Complaint  Patient presents with   Erectile Dysfunction    HPI: Gabriel Russell is a 65 y.o. male who presents for evaluation of erectile dysfunction.  He fell April 2015 sustaining a cervical injury and underwent multilevel anterior cervical decompression/fusion He was having no erectile problems prior to the injury but after the injury he had onset of ED Viagra was initially effective at the 100 mg dose Presently he has partial erections with Viagra but he has difficulty achieving an erection Cialis has not been effective Organic risk factors hypertension, hyperlipidemia, antihypertensive medications and tobacco use No pain or curvature with erection  He has BPH and is on dutasteride and tamsulosin and as long as he takes these medications he has no bothersome LUTS   PMH: Past Medical History:  Diagnosis Date   Arthritis    Concussion    GERD (gastroesophageal reflux disease)    OCC   Hyperlipidemia    Hypertension    Sleep apnea    cpap     >3 yrs   Subdural hematoma (HCC) 2014   AFTER A FALL ON ICE    Surgical History: Past Surgical History:  Procedure Laterality Date   ANTERIOR CERVICAL DECOMPRESSION/DISCECTOMY FUSION 4 LEVELS N/A 09/04/2013   Procedure: ANTERIOR CERVICAL DECOMPRESSION/DISCECTOMY FUSION CERVICAL THREE-FOUR, CERVICAL FOUR-FIVE, CERVICAL FIVE-SIX, CERVICALSIX-SEVEN ;  Surgeon: Reinaldo Meeker, MD;  Location: [facility];  Service: Neurosurgery;  Laterality: N/A;   BRAIN SURGERY     SUBDURAL HEMATOMA   JOINT REPLACEMENT Left    KNEE   SHOULDER ARTHROSCOPY WITH DEBRIDEMENT AND BICEP TENDON REPAIR Left 05/11/2016   Procedure: SHOULDER ARTHROSCOPY WITH DEBRIDEMENT AND BICEP TENDON REPAIR;  Surgeon: Christena Flake, MD;  Location: [facility];  Service: Orthopedics;  Laterality: Left;   SHOULDER ARTHROSCOPY WITH  OPEN ROTATOR CUFF REPAIR Left 05/11/2016   Procedure: SHOULDER ARTHROSCOPY WITH OPEN ROTATOR CUFF REPAIR;  Surgeon: Christena Flake, MD;  Location: [facility];  Service: Orthopedics;  Laterality: Left;   SHOULDER ARTHROSCOPY WITH SUBACROMIAL DECOMPRESSION Left 05/11/2016   Procedure: SHOULDER ARTHROSCOPY WITH SUBACROMIAL DECOMPRESSION;  Surgeon: Christena Flake, MD;  Location: [facility];  Service: Orthopedics;  Laterality: Left;    Home Medications:  Allergies as of 02/16/2021       Reactions   Amoxicillin    sweats   Lisinopril Swelling   Lips and throat        Medication List        Accurate as of February 16, 2021  2:06 PM. If you have any questions, ask your nurse or doctor.          amLODipine-valsartan 10-320 MG tablet Commonly known as: EXFORGE Take 1 tablet by mouth every morning.   atorvastatin 10 MG tablet Commonly known as: LIPITOR Take 10 mg by mouth every morning.   chlorthalidone 25 MG tablet Commonly known as: HYGROTON Take 1 tablet by mouth daily.   cyclobenzaprine 10 MG tablet Commonly known as: FLEXERIL Take 1 tablet (10 mg total) by mouth 3 (three) times daily as needed for muscle spasms.   dutasteride 0.5 MG capsule Commonly known as: AVODART Take 0.5 mg by mouth daily.   gabapentin 100 MG capsule Commonly known as: Neurontin Take 1-3 capsules (100-300 mg total) by mouth at bedtime. Follow written titration schedule.   GNP Calcium 1200 1200-1000 MG-UNIT Chew Chew 1,200  mg by mouth daily with breakfast. Take in combination with vitamin D and magnesium.   naproxen 500 MG tablet Commonly known as: NAPROSYN Take 500 mg by mouth 2 (two) times daily with a meal.   pantoprazole 40 MG tablet Commonly known as: PROTONIX Take 40 mg by mouth as needed.   sildenafil 100 MG tablet Commonly known as: VIAGRA Take 1 tab 1 one prior ton intercourse Started by: Riki Altes, MD   sildenafil 20 MG tablet Commonly known as: REVATIO Take 1-5 tablets by  mouth daily as needed.   tamsulosin 0.4 MG Caps capsule Commonly known as: FLOMAX Take 0.4 mg by mouth.        Allergies:  Allergies  Allergen Reactions   Amoxicillin     sweats   Lisinopril Swelling    Lips and throat    Family History: Family History  Problem Relation Age of Onset   Arthritis Mother    Diabetes Mother    Alcohol abuse Father     Social History:  reports that he has been smoking cigarettes. He has never used smokeless tobacco. He reports that he does not drink alcohol and does not use drugs.   Physical Exam: BP (!) 90/59   Pulse (!) 103   Ht 6\' 4"  (1.93 m)   Wt 255 lb (115.7 kg)   BMI 31.04 kg/m   Constitutional:  Alert and oriented, No acute distress. HEENT: [place] AT, moist mucus membranes.  Trachea midline, no masses. Cardiovascular: No clubbing, cyanosis, or edema. Respiratory: Normal respiratory effort, no increased work of breathing. GI: Abdomen is soft, nontender, nondistended, no abdominal masses GU: Phallus circumcised without lesions.  No corporal plaques.  Testes descended bilaterally without masses or tenderness Neurologic: Grossly intact, no focal deficits, moving all 4 extremities. Psychiatric: Normal mood and affect.   Assessment & Plan:    1.  Erectile dysfunction PDE 5 inhibitors no longer effective Second line options were discussed including vacuum erection devices and intracavernosal injection.  He was provided literature on both Penile implant surgery was also discussed and he was given literature At this point he is not interested in any of these options but will think over He did request a refill on sildenafil which was sent to pharmacy He will call back if interested in pursuing any of these other options.  2.  BPH with LUTS Stable on medical management   , MD  [facility] [address], [address] [place], [place] [phone]

## 2021-04-05 ENCOUNTER — Other Ambulatory Visit: Payer: Self-pay

## 2021-04-05 ENCOUNTER — Encounter: Payer: Self-pay | Admitting: Emergency Medicine

## 2021-04-05 DIAGNOSIS — E1122 Type 2 diabetes mellitus with diabetic chronic kidney disease: Secondary | ICD-10-CM | POA: Diagnosis not present

## 2021-04-05 DIAGNOSIS — N400 Enlarged prostate without lower urinary tract symptoms: Secondary | ICD-10-CM | POA: Diagnosis present

## 2021-04-05 DIAGNOSIS — Z981 Arthrodesis status: Secondary | ICD-10-CM | POA: Diagnosis not present

## 2021-04-05 DIAGNOSIS — I129 Hypertensive chronic kidney disease with stage 1 through stage 4 chronic kidney disease, or unspecified chronic kidney disease: Secondary | ICD-10-CM | POA: Diagnosis not present

## 2021-04-05 DIAGNOSIS — R42 Dizziness and giddiness: Secondary | ICD-10-CM | POA: Diagnosis not present

## 2021-04-05 DIAGNOSIS — M1990 Unspecified osteoarthritis, unspecified site: Secondary | ICD-10-CM | POA: Diagnosis present

## 2021-04-05 DIAGNOSIS — Z720 Tobacco use: Secondary | ICD-10-CM | POA: Diagnosis not present

## 2021-04-05 DIAGNOSIS — I10 Essential (primary) hypertension: Secondary | ICD-10-CM | POA: Diagnosis not present

## 2021-04-05 DIAGNOSIS — E1110 Type 2 diabetes mellitus with ketoacidosis without coma: Secondary | ICD-10-CM | POA: Diagnosis not present

## 2021-04-05 DIAGNOSIS — G4733 Obstructive sleep apnea (adult) (pediatric): Secondary | ICD-10-CM | POA: Diagnosis present

## 2021-04-05 DIAGNOSIS — K219 Gastro-esophageal reflux disease without esophagitis: Secondary | ICD-10-CM | POA: Diagnosis not present

## 2021-04-05 DIAGNOSIS — R059 Cough, unspecified: Secondary | ICD-10-CM | POA: Diagnosis not present

## 2021-04-05 DIAGNOSIS — E785 Hyperlipidemia, unspecified: Secondary | ICD-10-CM | POA: Diagnosis present

## 2021-04-05 DIAGNOSIS — Z881 Allergy status to other antibiotic agents status: Secondary | ICD-10-CM

## 2021-04-05 DIAGNOSIS — N179 Acute kidney failure, unspecified: Secondary | ICD-10-CM | POA: Diagnosis present

## 2021-04-05 DIAGNOSIS — M545 Low back pain, unspecified: Secondary | ICD-10-CM | POA: Diagnosis not present

## 2021-04-05 DIAGNOSIS — Z79899 Other long term (current) drug therapy: Secondary | ICD-10-CM

## 2021-04-05 DIAGNOSIS — Z7984 Long term (current) use of oral hypoglycemic drugs: Secondary | ICD-10-CM

## 2021-04-05 DIAGNOSIS — E119 Type 2 diabetes mellitus without complications: Secondary | ICD-10-CM | POA: Diagnosis not present

## 2021-04-05 DIAGNOSIS — Z20822 Contact with and (suspected) exposure to covid-19: Secondary | ICD-10-CM | POA: Diagnosis not present

## 2021-04-05 DIAGNOSIS — Z888 Allergy status to other drugs, medicaments and biological substances status: Secondary | ICD-10-CM | POA: Diagnosis not present

## 2021-04-05 DIAGNOSIS — E860 Dehydration: Secondary | ICD-10-CM | POA: Diagnosis present

## 2021-04-05 DIAGNOSIS — E871 Hypo-osmolality and hyponatremia: Secondary | ICD-10-CM | POA: Diagnosis present

## 2021-04-05 DIAGNOSIS — Z96652 Presence of left artificial knee joint: Secondary | ICD-10-CM | POA: Diagnosis present

## 2021-04-05 DIAGNOSIS — J45901 Unspecified asthma with (acute) exacerbation: Secondary | ICD-10-CM | POA: Diagnosis not present

## 2021-04-05 DIAGNOSIS — G894 Chronic pain syndrome: Secondary | ICD-10-CM | POA: Diagnosis present

## 2021-04-05 DIAGNOSIS — Z794 Long term (current) use of insulin: Secondary | ICD-10-CM

## 2021-04-05 DIAGNOSIS — F17210 Nicotine dependence, cigarettes, uncomplicated: Secondary | ICD-10-CM | POA: Diagnosis present

## 2021-04-05 DIAGNOSIS — E1111 Type 2 diabetes mellitus with ketoacidosis with coma: Secondary | ICD-10-CM | POA: Diagnosis not present

## 2021-04-05 DIAGNOSIS — G8929 Other chronic pain: Secondary | ICD-10-CM | POA: Diagnosis not present

## 2021-04-05 DIAGNOSIS — M533 Sacrococcygeal disorders, not elsewhere classified: Secondary | ICD-10-CM | POA: Diagnosis present

## 2021-04-05 DIAGNOSIS — R000 Tachycardia, unspecified: Secondary | ICD-10-CM | POA: Diagnosis not present

## 2021-04-05 DIAGNOSIS — R0602 Shortness of breath: Secondary | ICD-10-CM | POA: Diagnosis not present

## 2021-04-05 DIAGNOSIS — E1010 Type 1 diabetes mellitus with ketoacidosis without coma: Secondary | ICD-10-CM | POA: Diagnosis not present

## 2021-04-05 DIAGNOSIS — N182 Chronic kidney disease, stage 2 (mild): Secondary | ICD-10-CM | POA: Diagnosis present

## 2021-04-05 DIAGNOSIS — E1165 Type 2 diabetes mellitus with hyperglycemia: Secondary | ICD-10-CM | POA: Diagnosis not present

## 2021-04-05 LAB — URINALYSIS, ROUTINE W REFLEX MICROSCOPIC
Bacteria, UA: NONE SEEN
Bilirubin Urine: NEGATIVE
Glucose, UA: >=500 mg/dL — AB
Ketones, ur: 20 mg/dL — AB
Leukocytes,Ua: NEGATIVE
Nitrite: NEGATIVE
Protein, ur: NEGATIVE mg/dL
Specific Gravity, Urine: 1.028 (ref 1.005–1.030)
pH: 5 (ref 5.0–8.0)

## 2021-04-05 LAB — CBC
HCT: 42.3 % (ref 39.0–52.0)
Hemoglobin: 15.6 g/dL (ref 13.0–17.0)
MCH: 32.4 pg (ref 26.0–34.0)
MCHC: 36.9 g/dL — ABNORMAL HIGH (ref 30.0–36.0)
MCV: 87.8 fL (ref 80.0–100.0)
Platelets: 190 10*3/uL (ref 150–400)
RBC: 4.82 MIL/uL (ref 4.22–5.81)
RDW: 11.7 % (ref 11.5–15.5)
WBC: 8.9 10*3/uL (ref 4.0–10.5)
nRBC: 0 % (ref 0.0–0.2)

## 2021-04-05 LAB — BASIC METABOLIC PANEL
Anion gap: 15 (ref 5–15)
BUN: 33 mg/dL — ABNORMAL HIGH (ref 8–23)
CO2: 25 mmol/L (ref 22–32)
Calcium: 8.8 mg/dL — ABNORMAL LOW (ref 8.9–10.3)
Chloride: 88 mmol/L — ABNORMAL LOW (ref 98–111)
Creatinine, Ser: 1.68 mg/dL — ABNORMAL HIGH (ref 0.61–1.24)
GFR, Estimated: 45 mL/min — ABNORMAL LOW (ref >60)
Glucose, Bld: 791 mg/dL (ref 70–99)
Potassium: 4.4 mmol/L (ref 3.5–5.1)
Sodium: 128 mmol/L — ABNORMAL LOW (ref 135–145)

## 2021-04-05 LAB — TROPONIN I (HIGH SENSITIVITY): Troponin I (High Sensitivity): 10 ng/L (ref <18)

## 2021-04-05 LAB — CBG MONITORING, ED: Glucose-Capillary: >600 mg/dL (ref 70–99)

## 2021-04-05 NOTE — ED Triage Notes (Signed)
Pt to ED from home c/o dizziness, dehydration, gum pain, bilateral leg pain x4 days.  States every time he drinks he urinates suddenly after and has been drinking a lot of water, denies DBM hx.  Pt states goes to [place] monthly and never told diabetic, pt CBG reading HIGH in triage.  Pt A&Ox4, chest rise even and unlabored, in NAD at this time.

## 2021-04-06 ENCOUNTER — Emergency Department: Payer: Medicare HMO

## 2021-04-06 ENCOUNTER — Inpatient Hospital Stay
Admission: EM | Admit: 2021-04-06 | Discharge: 2021-04-07 | DRG: 638 | Disposition: A | Discharge status: Home / Self Care | Payer: Medicare HMO | Attending: Internal Medicine | Admitting: Internal Medicine

## 2021-04-06 ENCOUNTER — Encounter: Payer: Self-pay | Admitting: Internal Medicine

## 2021-04-06 DIAGNOSIS — M533 Sacrococcygeal disorders, not elsewhere classified: Secondary | ICD-10-CM | POA: Diagnosis present

## 2021-04-06 DIAGNOSIS — Z720 Tobacco use: Secondary | ICD-10-CM | POA: Diagnosis present

## 2021-04-06 DIAGNOSIS — M545 Low back pain, unspecified: Secondary | ICD-10-CM

## 2021-04-06 DIAGNOSIS — N182 Chronic kidney disease, stage 2 (mild): Secondary | ICD-10-CM | POA: Diagnosis present

## 2021-04-06 DIAGNOSIS — N179 Acute kidney failure, unspecified: Secondary | ICD-10-CM | POA: Diagnosis present

## 2021-04-06 DIAGNOSIS — Z79899 Other long term (current) drug therapy: Secondary | ICD-10-CM | POA: Diagnosis not present

## 2021-04-06 DIAGNOSIS — M1990 Unspecified osteoarthritis, unspecified site: Secondary | ICD-10-CM | POA: Diagnosis present

## 2021-04-06 DIAGNOSIS — E1122 Type 2 diabetes mellitus with diabetic chronic kidney disease: Secondary | ICD-10-CM | POA: Diagnosis present

## 2021-04-06 DIAGNOSIS — N400 Enlarged prostate without lower urinary tract symptoms: Secondary | ICD-10-CM | POA: Diagnosis present

## 2021-04-06 DIAGNOSIS — E785 Hyperlipidemia, unspecified: Secondary | ICD-10-CM | POA: Diagnosis present

## 2021-04-06 DIAGNOSIS — Z96652 Presence of left artificial knee joint: Secondary | ICD-10-CM | POA: Diagnosis present

## 2021-04-06 DIAGNOSIS — G8929 Other chronic pain: Secondary | ICD-10-CM | POA: Diagnosis present

## 2021-04-06 DIAGNOSIS — I10 Essential (primary) hypertension: Secondary | ICD-10-CM | POA: Diagnosis not present

## 2021-04-06 DIAGNOSIS — E119 Type 2 diabetes mellitus without complications: Secondary | ICD-10-CM | POA: Diagnosis not present

## 2021-04-06 DIAGNOSIS — E860 Dehydration: Secondary | ICD-10-CM

## 2021-04-06 DIAGNOSIS — I129 Hypertensive chronic kidney disease with stage 1 through stage 4 chronic kidney disease, or unspecified chronic kidney disease: Secondary | ICD-10-CM | POA: Diagnosis present

## 2021-04-06 DIAGNOSIS — F17210 Nicotine dependence, cigarettes, uncomplicated: Secondary | ICD-10-CM | POA: Diagnosis present

## 2021-04-06 DIAGNOSIS — E1111 Type 2 diabetes mellitus with ketoacidosis with coma: Secondary | ICD-10-CM

## 2021-04-06 DIAGNOSIS — Z881 Allergy status to other antibiotic agents status: Secondary | ICD-10-CM | POA: Diagnosis not present

## 2021-04-06 DIAGNOSIS — E1010 Type 1 diabetes mellitus with ketoacidosis without coma: Secondary | ICD-10-CM

## 2021-04-06 DIAGNOSIS — Z888 Allergy status to other drugs, medicaments and biological substances status: Secondary | ICD-10-CM | POA: Diagnosis not present

## 2021-04-06 DIAGNOSIS — K219 Gastro-esophageal reflux disease without esophagitis: Secondary | ICD-10-CM | POA: Diagnosis present

## 2021-04-06 DIAGNOSIS — R42 Dizziness and giddiness: Secondary | ICD-10-CM | POA: Diagnosis not present

## 2021-04-06 DIAGNOSIS — Z7984 Long term (current) use of oral hypoglycemic drugs: Secondary | ICD-10-CM | POA: Diagnosis not present

## 2021-04-06 DIAGNOSIS — E1110 Type 2 diabetes mellitus with ketoacidosis without coma: Secondary | ICD-10-CM | POA: Diagnosis present

## 2021-04-06 DIAGNOSIS — Z794 Long term (current) use of insulin: Secondary | ICD-10-CM | POA: Diagnosis not present

## 2021-04-06 DIAGNOSIS — R059 Cough, unspecified: Secondary | ICD-10-CM | POA: Diagnosis not present

## 2021-04-06 DIAGNOSIS — E871 Hypo-osmolality and hyponatremia: Secondary | ICD-10-CM | POA: Diagnosis present

## 2021-04-06 DIAGNOSIS — Z20822 Contact with and (suspected) exposure to covid-19: Secondary | ICD-10-CM | POA: Diagnosis present

## 2021-04-06 DIAGNOSIS — G4733 Obstructive sleep apnea (adult) (pediatric): Secondary | ICD-10-CM | POA: Diagnosis present

## 2021-04-06 DIAGNOSIS — Z981 Arthrodesis status: Secondary | ICD-10-CM | POA: Diagnosis not present

## 2021-04-06 DIAGNOSIS — G894 Chronic pain syndrome: Secondary | ICD-10-CM | POA: Diagnosis present

## 2021-04-06 DIAGNOSIS — R739 Hyperglycemia, unspecified: Secondary | ICD-10-CM

## 2021-04-06 LAB — BASIC METABOLIC PANEL
Anion gap: 18 — ABNORMAL HIGH (ref 5–15)
Anion gap: 8 (ref 5–15)
Anion gap: 9 (ref 5–15)
Anion gap: 9 (ref 5–15)
Anion gap: 7 (ref 5–15)
BUN: 33 mg/dL — ABNORMAL HIGH (ref 8–23)
BUN: 25 mg/dL — ABNORMAL HIGH (ref 8–23)
BUN: 24 mg/dL — ABNORMAL HIGH (ref 8–23)
BUN: 25 mg/dL — ABNORMAL HIGH (ref 8–23)
BUN: 26 mg/dL — ABNORMAL HIGH (ref 8–23)
CO2: 26 mmol/L (ref 22–32)
CO2: 30 mmol/L (ref 22–32)
CO2: 27 mmol/L (ref 22–32)
CO2: 24 mmol/L (ref 22–32)
CO2: 26 mmol/L (ref 22–32)
Calcium: 9.5 mg/dL (ref 8.9–10.3)
Calcium: 8.9 mg/dL (ref 8.9–10.3)
Calcium: 9.2 mg/dL (ref 8.9–10.3)
Calcium: 8.9 mg/dL (ref 8.9–10.3)
Calcium: 8.9 mg/dL (ref 8.9–10.3)
Chloride: 88 mmol/L — ABNORMAL LOW (ref 98–111)
Chloride: 103 mmol/L (ref 98–111)
Chloride: 103 mmol/L (ref 98–111)
Chloride: 103 mmol/L (ref 98–111)
Chloride: 102 mmol/L (ref 98–111)
Creatinine, Ser: 1.77 mg/dL — ABNORMAL HIGH (ref 0.61–1.24)
Creatinine, Ser: 1.27 mg/dL — ABNORMAL HIGH (ref 0.61–1.24)
Creatinine, Ser: 1.19 mg/dL (ref 0.61–1.24)
Creatinine, Ser: 1.12 mg/dL (ref 0.61–1.24)
Creatinine, Ser: 1.07 mg/dL (ref 0.61–1.24)
GFR, Estimated: 42 mL/min — ABNORMAL LOW (ref >60)
GFR, Estimated: >60 mL/min (ref >60)
GFR, Estimated: >60 mL/min (ref >60)
GFR, Estimated: >60 mL/min (ref >60)
GFR, Estimated: >60 mL/min (ref >60)
Glucose, Bld: 669 mg/dL (ref 70–99)
Glucose, Bld: 272 mg/dL — ABNORMAL HIGH (ref 70–99)
Glucose, Bld: 242 mg/dL — ABNORMAL HIGH (ref 70–99)
Glucose, Bld: 293 mg/dL — ABNORMAL HIGH (ref 70–99)
Glucose, Bld: 284 mg/dL — ABNORMAL HIGH (ref 70–99)
Potassium: 5 mmol/L (ref 3.5–5.1)
Potassium: 3.9 mmol/L (ref 3.5–5.1)
Potassium: 4.4 mmol/L (ref 3.5–5.1)
Potassium: 4.2 mmol/L (ref 3.5–5.1)
Potassium: 3.9 mmol/L (ref 3.5–5.1)
Sodium: 132 mmol/L — ABNORMAL LOW (ref 135–145)
Sodium: 141 mmol/L (ref 135–145)
Sodium: 139 mmol/L (ref 135–145)
Sodium: 136 mmol/L (ref 135–145)
Sodium: 135 mmol/L (ref 135–145)

## 2021-04-06 LAB — BLOOD GAS, VENOUS
Acid-Base Excess: 3 mmol/L — ABNORMAL HIGH (ref 0.0–2.0)
Bicarbonate: 29.1 mmol/L — ABNORMAL HIGH (ref 20.0–28.0)
O2 Saturation: 36.6 %
Patient temperature: 37
pCO2, Ven: 48 mmHg (ref 44.0–60.0)
pH, Ven: 7.39 (ref 7.250–7.430)
pO2, Ven: <31 mmHg — CL (ref 32.0–45.0)

## 2021-04-06 LAB — HIV ANTIBODY (ROUTINE TESTING W REFLEX): HIV Screen 4th Generation wRfx: NONREACTIVE

## 2021-04-06 LAB — CBG MONITORING, ED
Glucose-Capillary: 472 mg/dL — ABNORMAL HIGH (ref 70–99)
Glucose-Capillary: 468 mg/dL — ABNORMAL HIGH (ref 70–99)
Glucose-Capillary: 366 mg/dL — ABNORMAL HIGH (ref 70–99)
Glucose-Capillary: 304 mg/dL — ABNORMAL HIGH (ref 70–99)
Glucose-Capillary: 311 mg/dL — ABNORMAL HIGH (ref 70–99)
Glucose-Capillary: 276 mg/dL — ABNORMAL HIGH (ref 70–99)
Glucose-Capillary: 258 mg/dL — ABNORMAL HIGH (ref 70–99)
Glucose-Capillary: 207 mg/dL — ABNORMAL HIGH (ref 70–99)
Glucose-Capillary: 216 mg/dL — ABNORMAL HIGH (ref 70–99)
Glucose-Capillary: 232 mg/dL — ABNORMAL HIGH (ref 70–99)
Glucose-Capillary: 242 mg/dL — ABNORMAL HIGH (ref 70–99)

## 2021-04-06 LAB — GLUCOSE, CAPILLARY
Glucose-Capillary: 317 mg/dL — ABNORMAL HIGH (ref 70–99)
Glucose-Capillary: 340 mg/dL — ABNORMAL HIGH (ref 70–99)

## 2021-04-06 LAB — RESP PANEL BY RT-PCR (FLU A&B, COVID) ARPGX2
Influenza A by PCR: NEGATIVE
Influenza B by PCR: NEGATIVE
SARS Coronavirus 2 by RT PCR: NEGATIVE

## 2021-04-06 LAB — HEMOGLOBIN A1C
Hgb A1c MFr Bld: 9.4 % — ABNORMAL HIGH (ref 4.8–5.6)
Mean Plasma Glucose: 223.08 mg/dL

## 2021-04-06 LAB — BETA-HYDROXYBUTYRIC ACID: Beta-Hydroxybutyric Acid: 4.13 mmol/L — ABNORMAL HIGH (ref 0.05–0.27)

## 2021-04-06 MED ORDER — QUETIAPINE FUMARATE 25 MG PO TABS
25.0000 mg | ORAL_TABLET | Freq: Every day | ORAL | Status: DC
  Administered 2021-04-06: 25 mg via ORAL
  Filled 2021-04-06: qty 1

## 2021-04-06 MED ORDER — PHENOL 1.4 % MT LIQD
1.0000 | OROMUCOSAL | Status: DC | PRN
  Administered 2021-04-06: 1 via OROMUCOSAL
  Filled 2021-04-06: qty 177

## 2021-04-06 MED ORDER — INSULIN ASPART 100 UNIT/ML IJ SOLN
3.0000 [IU] | Freq: Three times a day (TID) | INTRAMUSCULAR | Status: DC
  Administered 2021-04-06 – 2021-04-07 (×5): 3 [IU] via SUBCUTANEOUS
  Filled 2021-04-06 (×5): qty 1

## 2021-04-06 MED ORDER — ONDANSETRON HCL 4 MG/2ML IJ SOLN
4.0000 mg | Freq: Once | INTRAMUSCULAR | Status: AC
  Administered 2021-04-06: 4 mg via INTRAVENOUS
  Filled 2021-04-06: qty 2

## 2021-04-06 MED ORDER — DEXTROSE-NACL 5-0.45 % IV SOLN: INTRAVENOUS | Status: DC

## 2021-04-06 MED ORDER — NICOTINE 21 MG/24HR TD PT24
21.0000 mg | MEDICATED_PATCH | Freq: Every day | TRANSDERMAL | Status: DC
  Administered 2021-04-07: 21 mg via TRANSDERMAL
  Filled 2021-04-06 (×2): qty 1

## 2021-04-06 MED ORDER — INFLUENZA VAC A&B SA ADJ QUAD 0.5 ML IM PRSY: 0.5000 mL | PREFILLED_SYRINGE | INTRAMUSCULAR | Status: DC

## 2021-04-06 MED ORDER — SODIUM CHLORIDE 0.9 % IV BOLUS
1000.0000 mL | Freq: Once | INTRAVENOUS | Status: AC
  Administered 2021-04-06: 1000 mL via INTRAVENOUS

## 2021-04-06 MED ORDER — INSULIN GLARGINE-YFGN 100 UNIT/ML ~~LOC~~ SOLN
15.0000 [IU] | Freq: Every day | SUBCUTANEOUS | Status: DC
  Administered 2021-04-06: 15 [IU] via SUBCUTANEOUS
  Filled 2021-04-06 (×2): qty 0.15

## 2021-04-06 MED ORDER — OYSTER SHELL CALCIUM/D3 500-5 MG-MCG PO TABS
2.0000 | ORAL_TABLET | Freq: Every day | ORAL | Status: DC
  Administered 2021-04-06 – 2021-04-07 (×2): 2 via ORAL
  Filled 2021-04-06 (×2): qty 2

## 2021-04-06 MED ORDER — HYDRALAZINE HCL 20 MG/ML IJ SOLN: 5.0000 mg | INTRAMUSCULAR | Status: DC | PRN

## 2021-04-06 MED ORDER — TAMSULOSIN HCL 0.4 MG PO CAPS
0.4000 mg | ORAL_CAPSULE | Freq: Every day | ORAL | Status: DC
  Administered 2021-04-06: 0.4 mg via ORAL
  Filled 2021-04-06: qty 1

## 2021-04-06 MED ORDER — INSULIN ASPART 100 UNIT/ML IJ SOLN: 10.0000 [IU] | Freq: Once | INTRAMUSCULAR | Status: DC

## 2021-04-06 MED ORDER — DEXTROSE 50 % IV SOLN: 0.0000 mL | INTRAVENOUS | Status: DC | PRN

## 2021-04-06 MED ORDER — PANTOPRAZOLE SODIUM 40 MG PO TBEC: 40.0000 mg | DELAYED_RELEASE_TABLET | Freq: Every day | ORAL | Status: DC | PRN

## 2021-04-06 MED ORDER — MECLIZINE HCL 25 MG PO TABS
25.0000 mg | ORAL_TABLET | Freq: Three times a day (TID) | ORAL | Status: DC | PRN
  Filled 2021-04-06: qty 1

## 2021-04-06 MED ORDER — GABAPENTIN 300 MG PO CAPS
300.0000 mg | ORAL_CAPSULE | Freq: Three times a day (TID) | ORAL | Status: DC
  Administered 2021-04-06 – 2021-04-07 (×5): 300 mg via ORAL
  Filled 2021-04-06 (×5): qty 1

## 2021-04-06 MED ORDER — LIVING WELL WITH DIABETES BOOK
Freq: Once | Status: AC
  Administered 2021-04-06: 1
  Filled 2021-04-06: qty 1

## 2021-04-06 MED ORDER — DEXTROSE IN LACTATED RINGERS 5 % IV SOLN: INTRAVENOUS | Status: DC

## 2021-04-06 MED ORDER — LACTATED RINGERS IV BOLUS: 20.0000 mL/kg | Freq: Once | INTRAVENOUS | Status: DC

## 2021-04-06 MED ORDER — ENOXAPARIN SODIUM 40 MG/0.4ML IJ SOSY: 40.0000 mg | PREFILLED_SYRINGE | INTRAMUSCULAR | Status: DC

## 2021-04-06 MED ORDER — ACETAMINOPHEN 325 MG PO TABS: 650.0000 mg | ORAL_TABLET | Freq: Four times a day (QID) | ORAL | Status: DC | PRN

## 2021-04-06 MED ORDER — CYCLOBENZAPRINE HCL 10 MG PO TABS: 10.0000 mg | ORAL_TABLET | Freq: Three times a day (TID) | ORAL | Status: DC | PRN

## 2021-04-06 MED ORDER — INSULIN REGULAR(HUMAN) IN NACL 100-0.9 UT/100ML-% IV SOLN
INTRAVENOUS | Status: DC
  Administered 2021-04-06: 6 [IU]/h via INTRAVENOUS
  Filled 2021-04-06: qty 100

## 2021-04-06 MED ORDER — INSULIN ASPART 100 UNIT/ML IJ SOLN
0.0000 [IU] | Freq: Three times a day (TID) | INTRAMUSCULAR | Status: DC
  Administered 2021-04-06: 3 [IU] via SUBCUTANEOUS
  Administered 2021-04-06 – 2021-04-07 (×2): 7 [IU] via SUBCUTANEOUS
  Filled 2021-04-06 (×3): qty 1

## 2021-04-06 MED ORDER — ATORVASTATIN CALCIUM 10 MG PO TABS
10.0000 mg | ORAL_TABLET | Freq: Every day | ORAL | Status: DC
  Administered 2021-04-06 – 2021-04-07 (×2): 10 mg via ORAL
  Filled 2021-04-06 (×2): qty 1

## 2021-04-06 MED ORDER — SODIUM CHLORIDE 0.9 % IV SOLN: INTRAVENOUS | Status: DC

## 2021-04-06 MED ORDER — ONDANSETRON HCL 4 MG/2ML IJ SOLN: 4.0000 mg | Freq: Three times a day (TID) | INTRAMUSCULAR | Status: DC | PRN

## 2021-04-06 MED ORDER — ENOXAPARIN SODIUM 60 MG/0.6ML IJ SOSY
0.5000 mg/kg | PREFILLED_SYRINGE | INTRAMUSCULAR | Status: DC
  Administered 2021-04-06 – 2021-04-07 (×2): 57.5 mg via SUBCUTANEOUS
  Filled 2021-04-06 (×2): qty 0.6

## 2021-04-06 MED ORDER — AMLODIPINE BESYLATE 10 MG PO TABS
10.0000 mg | ORAL_TABLET | Freq: Every day | ORAL | Status: DC
  Administered 2021-04-06 – 2021-04-07 (×2): 10 mg via ORAL
  Filled 2021-04-06 (×2): qty 1

## 2021-04-06 MED ORDER — POTASSIUM CHLORIDE 10 MEQ/100ML IV SOLN
10.0000 meq | INTRAVENOUS | Status: AC
  Administered 2021-04-06 (×2): 10 meq via INTRAVENOUS
  Filled 2021-04-06 (×2): qty 100

## 2021-04-06 MED ORDER — FLUOXETINE HCL 10 MG PO CAPS
10.0000 mg | ORAL_CAPSULE | Freq: Every morning | ORAL | Status: DC
  Administered 2021-04-06 – 2021-04-07 (×2): 10 mg via ORAL
  Filled 2021-04-06 (×5): qty 1

## 2021-04-06 MED ORDER — INSULIN REGULAR(HUMAN) IN NACL 100-0.9 UT/100ML-% IV SOLN: INTRAVENOUS | Status: DC

## 2021-04-06 MED ORDER — PNEUMOCOCCAL VAC POLYVALENT 25 MCG/0.5ML IJ INJ: 0.5000 mL | INJECTION | INTRAMUSCULAR | Status: DC

## 2021-04-06 MED ORDER — INSULIN ASPART 100 UNIT/ML IJ SOLN
0.0000 [IU] | Freq: Every day | INTRAMUSCULAR | Status: DC
  Administered 2021-04-06: 4 [IU] via SUBCUTANEOUS
  Filled 2021-04-06: qty 1

## 2021-04-06 MED ORDER — INSULIN STARTER KIT- PEN NEEDLES (ENGLISH)
1.0000 | Freq: Once | Status: DC
  Filled 2021-04-06: qty 1

## 2021-04-06 MED ORDER — LACTATED RINGERS IV SOLN: INTRAVENOUS | Status: DC

## 2021-04-06 NOTE — ED Notes (Signed)
Patient transported to X-ray 

## 2021-04-06 NOTE — H&P (Signed)
History and Physical    Gabriel Russell [id number] DOB: 1955-07-12 DOA: 04/06/2021  Referring MD/NP/PA:   PCP: Jodi Marble, MD   Patient coming from:  The patient is coming from home.  At baseline, pt is independent for most of ADL.        Chief Complaint: Polyuria, polydipsia, dizziness  HPI: Gabriel Russell is a 65 y.o. male with medical history significant of hypertension, hyperlipidemia, GERD, SDH 2014, OSA on CPAP, BPH, tobacco abuse, CKD-2, chronic back pain, chronic back pain, bilateral sacroiliac joint pain, who presents with polydipsia, polyuria and dizziness.  Patient states that he feels like he is dehydrated in the past 4 days.  He feels thirsty all the time.  He drinks a lot of water and urinate a lot.  No dysuria or burning on urination.  He also feel dizzy, no unilateral numbness or tinglings in extremities.  No facial droop or slurred speech. Patient does not have nausea, vomiting, diarrhea or abdominal pain.  He has mild dry cough, no shortness breath or chest pain.  No fever or chills.  ED Course: pt was found to have DKA (blood sugar 791, anion gap 18, bicarbonate 25, ABG with pH 7.39, beta hydroxybutyric acid 4.13, urinalysis showed positive ketone 20), A1c 9.4, troponin level 10, worsening renal function, WBC 8.9, negative COVID PCR, temperature normal, blood pressure 122/90, heart rate 115, RR 22, oxygen saturation 91-99% on room air.  Chest x-ray negative.  CT of head is negative for acute intracranial abnormalities.  Patient is admitted to stepdown as inpatient (changed bed to MedSurg bed after anion gap is closed).  Review of Systems:   General: no fevers, chills, no body weight gain, has fatigue HEENT: no blurry vision, hearing changes or sore throat Respiratory: no dyspnea, has coughing, no wheezing CV: no chest pain, no palpitations GI: no nausea, vomiting, abdominal pain, diarrhea, constipation GU: no dysuria, burning on urination, has increased urinary  frequency, no hematuria  Ext: no leg edema Neuro: no unilateral weakness, numbness, or tingling, no vision change or hearing loss. Has dizziness. Skin: no rash, no skin tear. MSK: No muscle spasm, no deformity, no limitation of range of movement in spin Heme: No easy bruising.  Travel history: No recent long distant travel.  Allergy:  Allergies  Allergen Reactions   Acetaminophen Nausea And Vomiting   Amoxicillin Swelling    sweats   Lisinopril Swelling    Lips and throat    Past Medical History:  Diagnosis Date   Arthritis    Concussion    GERD (gastroesophageal reflux disease)    OCC   Hyperlipidemia    Hypertension    Sleep apnea    cpap     >3 yrs   Subdural hematoma 2014   AFTER A FALL ON ICE    Past Surgical History:  Procedure Laterality Date   ANTERIOR CERVICAL DECOMPRESSION/DISCECTOMY FUSION 4 LEVELS N/A 09/04/2013   Procedure: ANTERIOR CERVICAL DECOMPRESSION/DISCECTOMY FUSION CERVICAL THREE-FOUR, CERVICAL FOUR-FIVE, CERVICAL FIVE-SIX, CERVICALSIX-SEVEN ;  Surgeon: Faythe Ghee, MD;  Location: [facility];  Service: Neurosurgery;  Laterality: N/A;   BRAIN SURGERY     SUBDURAL HEMATOMA   JOINT REPLACEMENT Left    KNEE   SHOULDER ARTHROSCOPY WITH DEBRIDEMENT AND BICEP TENDON REPAIR Left 05/11/2016   Procedure: SHOULDER ARTHROSCOPY WITH DEBRIDEMENT AND BICEP TENDON REPAIR;  Surgeon: Corky Mull, MD;  Location: [facility];  Service: Orthopedics;  Laterality: Left;   SHOULDER ARTHROSCOPY WITH OPEN ROTATOR CUFF REPAIR  Left 05/11/2016   Procedure: SHOULDER ARTHROSCOPY WITH OPEN ROTATOR CUFF REPAIR;  Surgeon: Christena Flake, MD;  Location: [facility];  Service: Orthopedics;  Laterality: Left;   SHOULDER ARTHROSCOPY WITH SUBACROMIAL DECOMPRESSION Left 05/11/2016   Procedure: SHOULDER ARTHROSCOPY WITH SUBACROMIAL DECOMPRESSION;  Surgeon: Christena Flake, MD;  Location: [facility];  Service: Orthopedics;  Laterality: Left;    Social History:  reports that he has been smoking  cigarettes. He has never used smokeless tobacco. He reports that he does not drink alcohol and does not use drugs.  Family History:  Family History  Problem Relation Age of Onset   Arthritis Mother    Diabetes Mother    Alcohol abuse Father      Prior to Admission medications   Medication Sig Start Date End Date Taking? Authorizing Provider  amLODipine-valsartan (EXFORGE) 10-320 MG per tablet Take 1 tablet by mouth every morning.     [provider]  atorvastatin (LIPITOR) 10 MG tablet Take 10 mg by mouth every morning.    [provider]  Calcium Carbonate-Vit D-Min (GNP CALCIUM 1200) 1200-1000 MG-UNIT CHEW Chew 1,200 mg by mouth daily with breakfast. Take in combination with vitamin D and magnesium. 09/12/17 03/11/18  Delano Metz, MD  chlorthalidone (HYGROTON) 25 MG tablet Take 1 tablet by mouth daily. 03/10/16   [provider]  cyclobenzaprine (FLEXERIL) 10 MG tablet Take 1 tablet (10 mg total) by mouth 3 (three) times daily as needed for muscle spasms. 09/05/13   Coletta Memos, MD  dutasteride (AVODART) 0.5 MG capsule Take 0.5 mg by mouth daily.    [provider]  gabapentin (NEURONTIN) 100 MG capsule Take 1-3 capsules (100-300 mg total) by mouth at bedtime. Follow written titration schedule. 09/12/17 10/12/17  Delano Metz, MD  naproxen (NAPROSYN) 500 MG tablet Take 500 mg by mouth 2 (two) times daily with a meal.    [provider]  pantoprazole (PROTONIX) 40 MG tablet Take 40 mg by mouth as needed.    [provider]  sildenafil (REVATIO) 20 MG tablet Take 1-5 tablets by mouth daily as needed. 04/05/16   [provider]  sildenafil (VIAGRA) 100 MG tablet Take 1 tab 1 one prior ton intercourse 02/16/21   Stoioff, Verna Czech, MD  tamsulosin (FLOMAX) 0.4 MG CAPS capsule Take 0.4 mg by mouth.    [provider]    Physical Exam: Vitals:   04/06/21 0630 04/06/21 0700 04/06/21 1000 04/06/21 1100  BP: (!) 144/84  122/90 123/90 137/86  Pulse: 88 93 76 80  Resp: 19 18 19 17   Temp:      TempSrc:      SpO2: 95% 91% 90% (!) 87%  Weight:      Height:       General: Not in acute distress.  Dry mucous membrane HEENT:       Eyes: PERRL, EOMI, no scleral icterus.       ENT: No discharge from the ears and nose, no pharynx injection, no tonsillar enlargement.        Neck: No JVD, no bruit, no mass felt. Heme: No neck lymph node enlargement. Cardiac: S1/S2, RRR, No murmurs, No gallops or rubs. Respiratory: No rales, wheezing, rhonchi or rubs. GI: Soft, nondistended, nontender, no rebound pain, no organomegaly, BS present. GU: No hematuria Ext: No pitting leg edema bilaterally. 1+DP/PT pulse bilaterally. Musculoskeletal: No joint deformities, No joint redness or warmth, no limitation of ROM in spin. Skin: No rashes.  Neuro: Alert, oriented X3,  cranial nerves II-XII grossly intact, moves all extremities normally.  Psych: Patient is not psychotic, no suicidal or hemocidal ideation.  Labs on Admission: I have personally reviewed following labs and imaging studies  CBC: Recent Labs  Lab 04/05/21 2229  WBC 8.9  HGB 15.6  HCT 42.3  MCV 87.8  PLT 99991111   Basic Metabolic Panel: Recent Labs  Lab 04/05/21 2229 04/06/21 0102 04/06/21 0838  NA 128* 132* 141  K 4.4 5.0 3.9  CL 88* 88* 103  CO2 25 26 30   GLUCOSE 791* 669* 272*  BUN 33* 33* 25*  CREATININE 1.68* 1.77* 1.27*  CALCIUM 8.8* 9.5 8.9   GFR: Estimated Creatinine Clearance: 79.6 mL/min (A) (by C-G formula based on SCr of 1.27 mg/dL (H)). Liver Function Tests: No results for input(s): AST, ALT, ALKPHOS, BILITOT, PROT, ALBUMIN in the last 168 hours. No results for input(s): LIPASE, AMYLASE in the last 168 hours. No results for input(s): AMMONIA in the last 168 hours. Coagulation Profile: No results for input(s): INR, PROTIME in the last 168 hours. Cardiac Enzymes: No results for input(s): CKTOTAL, CKMB, CKMBINDEX, TROPONINI in the last  168 hours. BNP (last 3 results) No results for input(s): PROBNP in the last 8760 hours. HbA1C: Recent Labs    04/05/21 2229  HGBA1C 9.4*   CBG: Recent Labs  Lab 04/06/21 0843 04/06/21 0940 04/06/21 1039 04/06/21 1144 04/06/21 1305  GLUCAP 276* 258* 207* 216* 232*   Lipid Profile: No results for input(s): CHOL, HDL, LDLCALC, TRIG, CHOLHDL, LDLDIRECT in the last 72 hours. Thyroid Function Tests: No results for input(s): TSH, T4TOTAL, FREET4, T3FREE, THYROIDAB in the last 72 hours. Anemia Panel: No results for input(s): VITAMINB12, FOLATE, FERRITIN, TIBC, IRON, RETICCTPCT in the last 72 hours. Urine analysis:    Component Value Date/Time   COLORURINE STRAW (A) 04/05/2021 2229   APPEARANCEUR CLEAR (A) 04/05/2021 2229   APPEARANCEUR Clear 04/09/2013 0915   LABSPEC 1.028 04/05/2021 2229   LABSPEC 1.026 04/09/2013 0915   PHURINE 5.0 04/05/2021 2229   GLUCOSEU >=500 (A) 04/05/2021 2229   GLUCOSEU Negative 04/09/2013 0915   HGBUR SMALL (A) 04/05/2021 2229   BILIRUBINUR NEGATIVE 04/05/2021 2229   BILIRUBINUR Negative 04/09/2013 0915   KETONESUR 20 (A) 04/05/2021 2229   PROTEINUR NEGATIVE 04/05/2021 2229   NITRITE NEGATIVE 04/05/2021 2229   LEUKOCYTESUR NEGATIVE 04/05/2021 2229   LEUKOCYTESUR Negative 04/09/2013 0915   Sepsis Labs: @LABRCNTIP (procalcitonin:4,lacticidven:4) ) Recent Results (from the past 240 hour(s))  Resp Panel by RT-PCR (Flu A&B, Covid) Nasopharyngeal Swab     Status: None   Collection Time: 04/06/21  1:02 AM   Specimen: Nasopharyngeal Swab; Nasopharyngeal(NP) swabs in vial transport medium  Result Value Ref Range Status   SARS Coronavirus 2 by RT PCR NEGATIVE NEGATIVE Final    Comment: (NOTE) SARS-CoV-2 target nucleic acids are NOT DETECTED.  The SARS-CoV-2 RNA is generally detectable in upper respiratory specimens during the acute phase of infection. The lowest concentration of SARS-CoV-2 viral copies this assay can detect is 138 copies/mL. A  negative result does not preclude SARS-Cov-2 infection and should not be used as the sole basis for treatment or other patient management decisions. A negative result may occur with  improper specimen collection/handling, submission of specimen other than nasopharyngeal swab, presence of viral mutation(s) within the areas targeted by this assay, and inadequate number of viral copies(<138 copies/mL). A negative result must be combined with clinical observations, patient history, and epidemiological information. The expected result is Negative.  Fact Sheet for  Patients:  BloggerCourse.com  Fact Sheet for Healthcare Providers:  SeriousBroker.it  This test is no t yet approved or cleared by the [place] FDA and  has been authorized for detection and/or diagnosis of SARS-CoV-2 by FDA under an Emergency Use Authorization (EUA). This EUA will remain  in effect (meaning this test can be used) for the duration of the COVID-19 declaration under Section 564(b)(1) of the Act, 21 U.S.C.section 360bbb-3(b)(1), unless the authorization is terminated  or revoked sooner.       Influenza A by PCR NEGATIVE NEGATIVE Final   Influenza B by PCR NEGATIVE NEGATIVE Final    Comment: (NOTE) The Xpert Xpress SARS-CoV-2/FLU/RSV plus assay is intended as an aid in the diagnosis of influenza from Nasopharyngeal swab specimens and should not be used as a sole basis for treatment. Nasal washings and aspirates are unacceptable for Xpert Xpress SARS-CoV-2/FLU/RSV testing.  Fact Sheet for Patients: BloggerCourse.com  Fact Sheet for Healthcare Providers: SeriousBroker.it  This test is not yet approved or cleared by the [place] FDA and has been authorized for detection and/or diagnosis of SARS-CoV-2 by FDA under an Emergency Use Authorization (EUA). This EUA will remain in effect (meaning this test can  be used) for the duration of the COVID-19 declaration under Section 564(b)(1) of the Act, 21 U.S.C. section 360bbb-3(b)(1), unless the authorization is terminated or revoked.  Performed at [facility], [address] [place], [place] [address]      Radiological Exams on Admission: DG Chest 2 View  Result Date: 04/06/2021 CLINICAL DATA:  Cough EXAM: CHEST - 2 VIEW COMPARISON:  08/26/2013 FINDINGS: The heart size and mediastinal contours are within normal limits. Both lungs are clear. The visualized skeletal structures are unremarkable. IMPRESSION: No active cardiopulmonary disease. Electronically Signed   By: Deatra Robinson M.D.   On: 04/06/2021 01:16   CT Head Wo Contrast  Result Date: 04/06/2021 CLINICAL DATA:  Dizziness and dehydration. EXAM: CT HEAD WITHOUT CONTRAST TECHNIQUE: Contiguous axial images were obtained from the base of the skull through the vertex without intravenous contrast. COMPARISON:  Head CT 01/13/2013 FINDINGS: Brain: There is mild cerebral atrophy and small vessel disease, small stable bifrontal subdural hygromas. There are small chronic lacunar infarcts in both external capsules. The brainstem and cerebellum are unremarkable. No asymmetry is seen concerning for an acute infarct, hemorrhage or mass. There is no midline shift. Vascular: There calcifications in the siphons and the distal vertebral arteries, but no hyperdense central vessels. Skull: There are bifrontal burr holes but no visible underlying encephalomalacia. The calvarium is otherwise unremarkable Sinuses/Orbits: Small retention cyst or polyp noted laterally in the right frontal sinus, unchanged. There is scattered membrane thickening in the ethmoids. Other visible sinuses and mastoid aircells are clear. Visualized orbits are unremarkable. Other: None. IMPRESSION: 1. No acute intracranial CT findings or interval changes. 2. Chronic findings discussed above. Electronically Signed   By: Almira Bar M.D.    On: 04/06/2021 03:58     EKG: I have personally reviewed.  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  Patient was treated with insulin drip per DKA protocol, anion gap is closed.  Switched to long-lasting glargine insulin and sliding scale insulin already.  Consulted diabetic educator.  -Admitted to MedSurg bed as inpatient -Glargine insulin 15 unit daily -Sliding scale insulin with meal coverage -IV fluid: Patient received 2 L of normal saline bolus  HTN (hypertension) -IV hydralazine as needed -Hold Cozaar and HCTZ due to worsening renal function -Start amlodipine 10 mg daily  Chronic low back pain (Secondary Area of Pain) (Bilateral) (L>R) and chronic sacroiliac joint pain (Bilateral) (L>R) -As needed Tylenol, Flexeril, gabapentin  HLD (hyperlipidemia) -Lipitor  GERD (gastroesophageal reflux disease) -Protonix  BPH (benign prostatic hyperplasia) -Flomax  Tobacco abuse -Nicotine patch  Acute renal failure superimposed on stage 2 chronic kidney disease ([place]): Baseline creatinine 1.0-1.2.  His creatinine is 1.77, BUN 33.  Likely due to dehydration. -2 L normal saline bolus  Dizziness: Likely due to dehydration.  No focal neurologic deficit on physical examination. -Fall precaution -IV fluid as  above         DVT ppx: SQ Lovenox Code Status: Full code Family Communication: not done, no family member is at bed side.     Disposition Plan:  Anticipate discharge back to previous environment Consults called:  none Admission status and Level of care: Med-Surg:     as inpt       Status is: Inpatient  Remains inpatient appropriate because: Patient has multiple complaints, now presents with new onset diabetes mellitus, with DKA.  Patient also has worsening renal function.  His presentation is highly complicated.  Patient is at high risk of deteriorating.  Will need to be treated in the hospital for at least 2 days.          Date of Service 04/06/2021    Ivor Costa [facility]   If 7PM-7AM, please contact night-coverage [email or web address] 04/06/2021, 1:25 PM

## 2021-04-06 NOTE — ED Notes (Signed)
Pt eating from lunch tray at this time. 

## 2021-04-06 NOTE — ED Notes (Signed)
Spoke with Dr Clyde Lundborg. Dr Clyde Lundborg will review chart and enter appropriate lab orders and diet order for this pt in DKA.

## 2021-04-06 NOTE — Progress Notes (Addendum)
Inpatient Diabetes Program Recommendations  AACE/ADA: New Consensus Statement on Inpatient Glycemic Control (2015)  Target Ranges:  Prepandial:   less than 140 mg/dL      Peak postprandial:   less than 180 mg/dL (1-2 hours)      Critically ill patients:  140 - 180 mg/dL   Lab Results  Component Value Date   GLUCAP 276 (H) 04/06/2021   HGBA1C 9.4 (H) 04/05/2021    Review of Glycemic Control Results for AAYANSH, CODISPOTI (MRN [id number]) as of 04/06/2021 08:43  Ref. Range 04/06/2021 04:27 04/06/2021 05:14 04/06/2021 05:50 04/06/2021 06:45 04/06/2021 07:44  Glucose-Capillary Latest Ref Range: 70 - 99 mg/dL 563 (H) 149 (H) 702 (H) 304 (H) 311 (H)   Diabetes history: New diagnosis of DM Outpatient Diabetes medications:  None Current orders for Inpatient glycemic control:  IV insulin  Inpatient Diabetes Program Recommendations:    Note new diagnosis of DM.  Will see patient today to discuss new diagnosis, survival skills and to review the use of insulin.   Thanks,  Beryl Meager, RN, BC-ADM Inpatient Diabetes Coordinator Pager [phone]  (8a-5p)  Addendum:  [phone]- Spoke with pt about new diagnosis.  Discussed A1C results with him and explained what an A1C is, basic pathophysiology of DM Type 2, basic home care, importance of checking CBGs and maintaining good CBG control to prevent long-term and short-term complications.  Reviewed signs and symptoms of hyperglycemia and hypoglycemia.  RNs to provide ongoing basic DM education at bedside with this patient. Patient has Living well with DM booklet at bedside.  He does not want to take insulin.  Explained that his blood sugar was very high prior to admit.  Patient states he had drank 2 gallons of OJ plus soda and Gatorade.  Reviewed the importance of eliminating sugar from his beverages.  We also reviewed nutrition label and how to determine CHO count of foods.  Showed patient the plate method as well.  A1C indicates average blood sugars  approximately 223 mg/dL.  May consider adding Metformin 500 mg bid.  At this time patient tells me he "does not like needles and does not want insulin".  He will follow up with provider at the [place].  Will f/u with patient on 11/3 and review insulin with him if he will d/c with insulin.

## 2021-04-06 NOTE — ED Notes (Signed)
Pt C/O dry mouth and hunger. Provided pt lemon glycerine swabs per his request.

## 2021-04-06 NOTE — ED Notes (Signed)
Pt to CT

## 2021-04-06 NOTE — ED Notes (Signed)
Heather RN aware of assigned bed 

## 2021-04-06 NOTE — Progress Notes (Signed)
PHARMACIST - PHYSICIAN COMMUNICATION  CONCERNING:  Enoxaparin (Lovenox) for DVT Prophylaxis    RECOMMENDATION: Patient was prescribed enoxaprin 40mg  q24 hours for VTE prophylaxis.   Filed Weights   04/05/21 2222  Weight: 115.7 kg (255 lb)    Body mass index is 31.87 kg/m.  Estimated Creatinine Clearance: 57.1 mL/min (A) (by C-G formula based on SCr of 1.77 mg/dL (H)).   Based on [facility] policy patient is candidate for enoxaparin 0.5mg /kg TBW SQ every 24 hours based on BMI being >30.   DESCRIPTION: Pharmacy has adjusted enoxaparin dose per [facility] policy.  Patient is now receiving enoxaparin 57.5 mg every 24 hours    [facility], PharmD Clinical Pharmacist  04/06/2021 8:22 AM

## 2021-04-06 NOTE — ED Notes (Addendum)
Pt called out on call bell 2 times in 5 minutes to ask for food. Pt was informed both times that after this CBG check and insulin adjustment, attending will be contacted to request diet order. Heather, RN in room now to check CBG. Lab contacted to see if BMP was redrawn this morning because last BMP in chart was drawn around 0100. Spoke with Boneta Lucks in lab. There are no orders for repeat labs to monitor DKA progress.Will contact attending to discuss serial labs for this pt.

## 2021-04-06 NOTE — ED Provider Notes (Signed)
[facility] Emergency Department Provider Note   ____________________________________________   Event Date/Time   First MD Initiated Contact with Patient 04/06/21 0033     (approximate)  I have reviewed the triage vital signs and the nursing notes.   HISTORY  Chief Complaint Dizziness    HPI Edker Bergsten is a 65 y.o. male who presents to the ED from home with a chief complaint of dizziness, feeling dehydrated, swollen gums, nausea, bilateral leg pain.  Denies prior history of diabetes.  Complains of polydipsia and polyuria.  Also endorses mild dry cough.  Denies fever, chest pain, abdominal pain, vomiting or diarrhea.      Past Medical History:  Diagnosis Date   Arthritis    Concussion    GERD (gastroesophageal reflux disease)    OCC   Hyperlipidemia    Hypertension    Sleep apnea    cpap     >3 yrs   Subdural hematoma 2014   AFTER A FALL ON ICE    Patient Active Problem List   Diagnosis Date Noted   Neurogenic pain 09/12/2017   Cervicalgia 08/21/2017   Vitamin D deficiency 07/23/2017   Cervical facet syndrome (Bilateral) (L>R) 07/23/2017   Cervical foraminal stenosis (Right: C5-6) (Left: C6-7) 07/23/2017   Lumbar facet arthropathy (Bilateral) 07/23/2017   Lumbar facet syndrome (Bilateral) (L>R) 07/23/2017   Lumbar facet osteoarthritis (Bilateral) 07/23/2017   History of marijuana use 07/23/2017   Chronic neck pain (Primary Area of Pain) (Bilateral) (L>R) 07/22/2017   History of cervical spinal surgery 07/22/2017   Chronic lower extremity pain (Tertiary Area of Pain) (Bilateral) (L>R) 07/22/2017   Opiate use (30 MME/Day) 07/22/2017   Elevated C-reactive protein (CRP) 07/19/2017   Elevated sed rate 07/19/2017   Spondylosis without myelopathy or radiculopathy, cervical region 07/17/2017   Chronic low back pain (Secondary Area of Pain) (Bilateral) (L>R) 07/17/2017   Chronic sacroiliac joint pain (Bilateral) (L>R) 07/17/2017   Chronic  pain of knees (Fourth Area of Pain) (Bilateral) (L>R) 07/17/2017   Chronic shoulder pain (Fifth Area of Pain) (Left) 07/17/2017   Chronic pain syndrome 07/17/2017   Long term current use of opiate analgesic 07/17/2017   Pharmacologic therapy 07/17/2017   Disorder of skeletal system 07/17/2017   Problems influencing health status 07/17/2017   Upper biceps tendon Tendonitis (Left) 05/15/2016   Rotator cuff Tendinitis (Left) 04/24/2016   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) 09/04/2013   HTN (hypertension) 09/20/2012    Past Surgical History:  Procedure Laterality Date   ANTERIOR CERVICAL DECOMPRESSION/DISCECTOMY FUSION 4 LEVELS N/A 09/04/2013   Procedure: ANTERIOR CERVICAL DECOMPRESSION/DISCECTOMY FUSION CERVICAL THREE-FOUR, CERVICAL FOUR-FIVE, CERVICAL FIVE-SIX, CERVICALSIX-SEVEN ;  Surgeon: Faythe Ghee, MD;  Location: [place] [facility];  Service: Neurosurgery;  Laterality: N/A;   BRAIN SURGERY     SUBDURAL HEMATOMA   JOINT REPLACEMENT Left    KNEE   SHOULDER ARTHROSCOPY WITH DEBRIDEMENT AND BICEP TENDON REPAIR Left 05/11/2016   Procedure: SHOULDER ARTHROSCOPY WITH DEBRIDEMENT AND BICEP TENDON REPAIR;  Surgeon: Corky Mull, MD;  Location: [facility];  Service: Orthopedics;  Laterality: Left;   SHOULDER ARTHROSCOPY WITH OPEN ROTATOR CUFF REPAIR Left 05/11/2016   Procedure: SHOULDER ARTHROSCOPY WITH OPEN ROTATOR CUFF REPAIR;  Surgeon: Corky Mull, MD;  Location: [facility];  Service: Orthopedics;  Laterality: Left;   SHOULDER ARTHROSCOPY WITH SUBACROMIAL DECOMPRESSION Left 05/11/2016   Procedure: SHOULDER ARTHROSCOPY WITH SUBACROMIAL DECOMPRESSION;  Surgeon: Corky Mull, MD;  Location: [facility];  Service: Orthopedics;  Laterality: Left;  Prior to Admission medications   Medication Sig Start Date End Date Taking? Authorizing Provider  amLODipine-valsartan (EXFORGE) 10-320 MG per tablet Take 1 tablet by mouth every morning.     [provider]  atorvastatin (LIPITOR) 10 MG tablet  Take 10 mg by mouth every morning.    [provider]  Calcium Carbonate-Vit D-Min (GNP CALCIUM 1200) 1200-1000 MG-UNIT CHEW Chew 1,200 mg by mouth daily with breakfast. Take in combination with vitamin D and magnesium. 09/12/17 03/11/18  Milinda Pointer, MD  chlorthalidone (HYGROTON) 25 MG tablet Take 1 tablet by mouth daily. 03/10/16   [provider]  cyclobenzaprine (FLEXERIL) 10 MG tablet Take 1 tablet (10 mg total) by mouth 3 (three) times daily as needed for muscle spasms. 09/05/13   Ashok Pall, MD  dutasteride (AVODART) 0.5 MG capsule Take 0.5 mg by mouth daily.    [provider]  gabapentin (NEURONTIN) 100 MG capsule Take 1-3 capsules (100-300 mg total) by mouth at bedtime. Follow written titration schedule. 09/12/17 10/12/17  Milinda Pointer, MD  naproxen (NAPROSYN) 500 MG tablet Take 500 mg by mouth 2 (two) times daily with a meal.    [provider]  pantoprazole (PROTONIX) 40 MG tablet Take 40 mg by mouth as needed.    [provider]  sildenafil (REVATIO) 20 MG tablet Take 1-5 tablets by mouth daily as needed. 04/05/16   [provider]  sildenafil (VIAGRA) 100 MG tablet Take 1 tab 1 one prior ton intercourse 02/16/21   Stoioff, Ronda Fairly, MD  tamsulosin (FLOMAX) 0.4 MG CAPS capsule Take 0.4 mg by mouth.    [provider]    Allergies Amoxicillin and Lisinopril  Family History  Problem Relation Age of Onset   Arthritis Mother    Diabetes Mother    Alcohol abuse Father     Social History Social History   Tobacco Use   Smoking status: Light Smoker    Years: 15.00    Types: Cigarettes   Smokeless tobacco: Never   Tobacco comments:    2 cigarettes per day  Vaping Use   Vaping Use: Never used  Substance Use Topics   Alcohol use: No   Drug use: No    Comment: pt denies but UDS in 2014 + for marijuana    Review of Systems  Constitutional: Positive for polydipsia, polyuria.  Positive for dehydration.  No  fever/chills Eyes: No visual changes. ENT: No sore throat. Cardiovascular: Denies chest pain. Respiratory: Positive for cough.  Denies shortness of breath. Gastrointestinal: No abdominal pain.  No nausea, no vomiting.  No diarrhea.  No constipation. Genitourinary: Negative for dysuria. Musculoskeletal: Negative for back pain. Skin: Negative for rash. Neurological: Positive for dizziness.  Negative for headaches, focal weakness or numbness.   ____________________________________________   PHYSICAL EXAM:  VITAL SIGNS: ED Triage Vitals  Enc Vitals Group     BP 04/05/21 2222 137/87     Pulse Rate 04/05/21 2222 (!) 115     Resp 04/05/21 2222 16     Temp 04/05/21 2222 98.7 F (37.1 C)     Temp Source 04/05/21 2222 Oral     SpO2 04/05/21 2222 99 %     Weight 04/05/21 2222 255 lb (115.7 kg)     Height 04/05/21 2222 6\' 3"  (1.905 m)     Head Circumference --      Peak Flow --      Pain Score 04/05/21 2223 7     Pain Loc --  Pain Edu? --      Excl. in [place]? --     Constitutional: Alert and oriented.  Elderly appearing and in mild acute distress. Eyes: Conjunctivae are normal. PERRL. EOMI. Head: Atraumatic. Nose: No congestion/rhinnorhea. Mouth/Throat: Mucous membranes are mildly dry. Neck: No stridor.   Cardiovascular: Tachycardic rate, regular rhythm. Grossly normal heart sounds.  Good peripheral circulation. Respiratory: Normal respiratory effort.  No retractions. Lungs CTAB. Gastrointestinal: Soft and nontender to light or deep palpation. No distention. No abdominal bruits. No CVA tenderness. Musculoskeletal: No lower extremity tenderness nor edema.  No joint effusions. Neurologic: Alert and oriented x3.  CN II to XII grossly intact normal speech and language. No gross focal neurologic deficits are appreciated. No gait instability. Skin:  Skin is warm, dry and intact. No rash noted. Psychiatric: Mood and affect are normal. Speech and behavior are  normal.  ____________________________________________   LABS (all labs ordered are listed, but only abnormal results are displayed)  Labs Reviewed  BASIC METABOLIC PANEL - Abnormal; Notable for the following components:      Result Value   Sodium 128 (*)    Chloride 88 (*)    Glucose, Bld 791 (*)    BUN 33 (*)    Creatinine, Ser 1.68 (*)    Calcium 8.8 (*)    GFR, Estimated 45 (*)    All other components within normal limits  CBC - Abnormal; Notable for the following components:   MCHC 36.9 (*)    All other components within normal limits  URINALYSIS, ROUTINE W REFLEX MICROSCOPIC - Abnormal; Notable for the following components:   Color, Urine STRAW (*)    APPearance CLEAR (*)    Glucose, UA >=500 (*)    Hgb urine dipstick SMALL (*)    Ketones, ur 20 (*)    All other components within normal limits  BLOOD GAS, VENOUS - Abnormal; Notable for the following components:   pO2, Ven <31.0 (*)    Bicarbonate 29.1 (*)    Acid-Base Excess 3.0 (*)    All other components within normal limits  BETA-HYDROXYBUTYRIC ACID - Abnormal; Notable for the following components:   Beta-Hydroxybutyric Acid 4.13 (*)    All other components within normal limits  HEMOGLOBIN A1C - Abnormal; Notable for the following components:   Hgb A1c MFr Bld 9.4 (*)    All other components within normal limits  BASIC METABOLIC PANEL - Abnormal; Notable for the following components:   Sodium 132 (*)    Chloride 88 (*)    Glucose, Bld 669 (*)    BUN 33 (*)    Creatinine, Ser 1.77 (*)    GFR, Estimated 42 (*)    Anion gap 18 (*)    All other components within normal limits  CBG MONITORING, ED - Abnormal; Notable for the following components:   Glucose-Capillary >600 (*)    All other components within normal limits  CBG MONITORING, ED - Abnormal; Notable for the following components:   Glucose-Capillary 472 (*)    All other components within normal limits  RESP PANEL BY RT-PCR (FLU A&B, COVID) ARPGX2  CBG  MONITORING, ED  TROPONIN I (HIGH SENSITIVITY)   ____________________________________________  EKG  ED ECG REPORT I, Neeraj Housand J, the attending physician, personally viewed and interpreted this ECG.   Date: 04/06/2021  EKG Time: 2229  Rate: 114  Rhythm: sinus tachycardia  Axis: Normal  Intervals:left anterior fascicular block  ST&T Change: Nonspecific  ____________________________________________  RADIOLOGY Larey Seat, personally viewed  and evaluated these images (plain radiographs) as part of my medical decision making, as well as reviewing the written report by the radiologist.  ED MD interpretation: No acute cardiopulmonary process  Official radiology report(s): DG Chest 2 View  Result Date: 04/06/2021 CLINICAL DATA:  Cough EXAM: CHEST - 2 VIEW COMPARISON:  08/26/2013 FINDINGS: The heart size and mediastinal contours are within normal limits. Both lungs are clear. The visualized skeletal structures are unremarkable. IMPRESSION: No active cardiopulmonary disease. Electronically Signed   By: Deatra Robinson M.D.   On: 04/06/2021 01:16    ____________________________________________   PROCEDURES  Procedure(s) performed (including Critical Care):  .1-3 Lead EKG Interpretation Performed by: Irean Hong, MD Authorized by: Irean Hong, MD     Interpretation: abnormal     ECG rate:  114   ECG rate assessment: tachycardic     Rhythm: sinus tachycardia     Ectopy: none     Conduction: normal   Comments:     Patient placed on cardiac monitor to evaluate for arrhythmias  CRITICAL CARE Performed by: Irean Hong   Total critical care time: 45 minutes  Critical care time was exclusive of separately billable procedures and treating other patients.  Critical care was necessary to treat or prevent imminent or life-threatening deterioration.  Critical care was time spent personally by me on the following activities: development of treatment plan with patient and/or  surrogate as well as nursing, discussions with consultants, evaluation of patient's response to treatment, examination of patient, obtaining history from patient or surrogate, ordering and performing treatments and interventions, ordering and review of laboratory studies, ordering and review of radiographic studies, pulse oximetry and re-evaluation of patient's condition.  ____________________________________________   INITIAL IMPRESSION / ASSESSMENT AND PLAN / ED COURSE  As part of my medical decision making, I reviewed the following data within the electronic MEDICAL RECORD NUMBER Nursing notes reviewed and incorporated, Labs reviewed, EKG interpreted, Old chart reviewed, Radiograph reviewed, and Notes from prior ED visits     65 year old male presenting with dizziness, dehydration, polydipsia, polyuria.  Differential diagnosis includes but is not limited to TIA/CVA, ACS, infectious, metabolic etiology, etc.  Laboratory results remarkable for hyponatremia, hyperglycemia without elevation of anion gap, AKI.  Ketonuria.  Will obtain VBG, beta hydroxybutyrate, check hemoglobin A1c which will be a send out test.  Obtain CT head for complaint of dizziness, chest x-ray for complaint of cough; obtain COVID swab.  Initiate IV fluid resuscitation, antiemetic, insulin and reassess.    ----------------------------------------- 4:03 AM on 04/06/2021 -----------------------------------------   Patient was placed on Endo tool for elevated anion gap, repeat metabolic panel found worsening creatinine and slightly improved glucose.  Beta hydroxybutyrate elevated.  CT head is unremarkable.  Will discuss with hospitalist services for admission.  ____________________________________________   FINAL CLINICAL IMPRESSION(S) / ED DIAGNOSES  Final diagnoses:  Dizziness  Dehydration  Hyperglycemia  AKI (acute kidney injury) (HCC)  Diabetic ketoacidosis with coma associated with type 2 diabetes mellitus [facility])      ED Discharge Orders     None        Note:  This document was prepared using Dragon voice recognition software and may include unintentional dictation errors.    Irean Hong, MD 04/06/21 [phone]

## 2021-04-06 NOTE — ED Notes (Signed)
Pt will come off IV insulin and D51/2NS at 1355. Diet order entered for pt. Dietary was called and they will bring late lunch tray for pt. Pt informed.

## 2021-04-06 NOTE — ED Notes (Signed)
Pt given warm wipes, clean gown and socks at family member's request.  Family member in room at this time helping pt to clean self.

## 2021-04-07 LAB — GLUCOSE, CAPILLARY
Glucose-Capillary: 338 mg/dL — ABNORMAL HIGH (ref 70–99)
Glucose-Capillary: 373 mg/dL — ABNORMAL HIGH (ref 70–99)
Glucose-Capillary: 190 mg/dL — ABNORMAL HIGH (ref 70–99)

## 2021-04-07 LAB — BASIC METABOLIC PANEL
Anion gap: 9 (ref 5–15)
Anion gap: 9 (ref 5–15)
BUN: 23 mg/dL (ref 8–23)
BUN: 21 mg/dL (ref 8–23)
CO2: 24 mmol/L (ref 22–32)
CO2: 24 mmol/L (ref 22–32)
Calcium: 8.8 mg/dL — ABNORMAL LOW (ref 8.9–10.3)
Calcium: 8.8 mg/dL — ABNORMAL LOW (ref 8.9–10.3)
Chloride: 103 mmol/L (ref 98–111)
Chloride: 103 mmol/L (ref 98–111)
Creatinine, Ser: 1.06 mg/dL (ref 0.61–1.24)
Creatinine, Ser: 1.1 mg/dL (ref 0.61–1.24)
GFR, Estimated: >60 mL/min (ref >60)
GFR, Estimated: >60 mL/min (ref >60)
Glucose, Bld: 254 mg/dL — ABNORMAL HIGH (ref 70–99)
Glucose, Bld: 264 mg/dL — ABNORMAL HIGH (ref 70–99)
Potassium: 3.8 mmol/L (ref 3.5–5.1)
Potassium: 3.9 mmol/L (ref 3.5–5.1)
Sodium: 136 mmol/L (ref 135–145)
Sodium: 136 mmol/L (ref 135–145)

## 2021-04-07 LAB — CBC
HCT: 39.4 % (ref 39.0–52.0)
Hemoglobin: 13.5 g/dL (ref 13.0–17.0)
MCH: 30.3 pg (ref 26.0–34.0)
MCHC: 34.3 g/dL (ref 30.0–36.0)
MCV: 88.5 fL (ref 80.0–100.0)
Platelets: 153 10*3/uL (ref 150–400)
RBC: 4.45 MIL/uL (ref 4.22–5.81)
RDW: 11.8 % (ref 11.5–15.5)
WBC: 6.3 10*3/uL (ref 4.0–10.5)
nRBC: 0 % (ref 0.0–0.2)

## 2021-04-07 MED ORDER — METFORMIN HCL 500 MG PO TABS: 500.0000 mg | ORAL_TABLET | Freq: Two times a day (BID) | ORAL | 0 refills | Status: DC

## 2021-04-07 MED ORDER — INSULIN ASPART 100 UNIT/ML IJ SOLN
0.0000 [IU] | Freq: Three times a day (TID) | INTRAMUSCULAR | Status: DC
  Administered 2021-04-07: 15 [IU] via SUBCUTANEOUS
  Administered 2021-04-07: 3 [IU] via SUBCUTANEOUS
  Filled 2021-04-07: qty 1

## 2021-04-07 MED ORDER — INSULIN GLARGINE-YFGN 100 UNIT/ML ~~LOC~~ SOLN
20.0000 [IU] | Freq: Every day | SUBCUTANEOUS | Status: DC
  Administered 2021-04-07: 20 [IU] via SUBCUTANEOUS
  Filled 2021-04-07 (×2): qty 0.2

## 2021-04-07 MED ORDER — GLIMEPIRIDE 4 MG PO TABS: 4.0000 mg | ORAL_TABLET | ORAL | 0 refills | Status: DC

## 2021-04-07 MED ORDER — BLOOD GLUCOSE MONITOR KIT: PACK | 0 refills | Status: AC

## 2021-04-07 NOTE — Progress Notes (Addendum)
Inpatient Diabetes Program Recommendations  AACE/ADA: New Consensus Statement on Inpatient Glycemic Control (2015)  Target Ranges:  Prepandial:   less than 140 mg/dL      Peak postprandial:   less than 180 mg/dL (1-2 hours)      Critically ill patients:  140 - 180 mg/dL   Lab Results  Component Value Date   GLUCAP 373 (H) 04/07/2021   HGBA1C 9.4 (H) 04/05/2021    Review of Glycemic Control Results for Gabriel Russell, Gabriel Russell (MRN [id number]) as of 04/07/2021 14:52  Ref. Range 04/06/2021 14:01 04/06/2021 17:40 04/06/2021 19:53 04/07/2021 09:10 04/07/2021 11:56  Glucose-Capillary Latest Ref Range: 70 - 99 mg/dL 621 (H) 308 (H) 657 (H) 338 (H) 373 (H)  Diabetes history: New diagnosis of DM Outpatient Diabetes medications:  None Current orders for Inpatient glycemic control:  Novolog moderate tid with meals and HS Novolog 3 units tid with meals Semglee 20 units daily  Inpatient Diabetes Program Recommendations:    Per MD patient reluctant to start insulin. I spoke with patient yesterday.  I am concerned at how quickly his blood sugars have gone back up?  [facility] within the [facility].  Since patient does not want to start insulin at this time, recommend Metformin 500 mg bid and titrate up to 1000 mg bid.  May also need sulfolnyurea such as Amaryl 4 mg daily.  This is a start but he needs close follow up with PCP.  Reviewed diet information with patient yesterday and reviewed LWWD booklet as well.   Thanks,  Beryl Meager, RN, BC-ADM Inpatient Diabetes Coordinator Pager [phone]  (8a-5p)  Addendum 1500: Spoke with patient by phone prior to d/c.  Reviewed normal blood sugar values with him as well as the importance of checking blood sugars at least 2 times a day.  Reminded him that he "May need insulin" and needs close f/u with his MD at the [place].  Reviewed the importance of eliminating sugar from his beverages and also reducing the amount of CHO intake  including pasta, bread and potatoes.  Patient verbalized understanding and states he will follow up. Also reinforced that he may need insulin sooner than later and that the pills may not be enough to keep his blood sugars normal.

## 2021-04-07 NOTE — Discharge Summary (Signed)
Physician Discharge Summary  Gabriel Russell YPP:[id number] DOB: [birth year] DOA: 04/06/2021  PCP: Jodi Marble, MD  Admit date: 04/06/2021 Discharge date: 04/07/2021  Admitted From: Home Disposition: Home  Recommendations for Outpatient Follow-up:  Follow-up with PCP in 1 week   Home Health: No Equipment/Devices: None  Discharge Condition: Stable CODE STATUS: Full Diet recommendation: Carb modified  Brief/Interim Summary:. 65 y.o. male with medical history significant of hypertension, hyperlipidemia, GERD, SDH 2014, OSA on CPAP, BPH, tobacco abuse, CKD-2, chronic back pain, chronic back pain, bilateral sacroiliac joint pain, who presents with polydipsia, polyuria and dizziness.   Patient states that he feels like he is dehydrated in the past 4 days.  He feels thirsty all the time.  He drinks a lot of water and urinate a lot.  No dysuria or burning on urination.  He also feel dizzy, no unilateral numbness or tinglings in extremities.  No facial droop or slurred speech. Patient does not have nausea, vomiting, diarrhea or abdominal pain.  He has mild dry cough, no shortness breath or chest pain.  No fever or chills.  Anion gap closed.  Patient transitioned off insulin GTT to subcutaneous regimen.  Diabetes coordinator consulted.  Recommendations appreciated.  Lengthy conversation with patient at time of discharge.  Patient is reluctant to start insulin at this time.  I explained that we could attempt oral medications and repeat hemoglobin A1c in 3 months.  If A1c remains elevated then insulin would likely be the neck step.  Patient expressed understanding.  Literature regarding nutrition and diabetes action plan provided in discharge packet.  Patient will discharge on metformin 500 twice daily and Amaryl 4 mg daily.  Metformin will likely need to be uptitrated within 1 to 2 weeks but patient will need close follow-up with his established physicians at the [place].    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  Patient was treated with insulin drip per DKA protocol, anion gap is closed.  Switched to long-lasting glargine insulin and sliding scale insulin already.  Consulted diabetic educator.  Blood glucose are improved and symptoms are improved at time of discharge.  Remains somewhat hyperglycemic.  Patient reluctant to start insulin at this time.  At time of discharge will prescribe metformin 500 mg p.o. twice daily, Amaryl 4 mg daily as well as a glucometer.  Diabetic education provided by attending as well as diabetes coordinator.  Patient will need close follow-up with his primary care physicians.  Will likely need up titration of metformin within 1 to 2 weeks.    Discharge Instructions  Discharge Instructions     Diet - low sodium heart healthy   Complete by: As directed    Increase activity slowly   Complete by: As directed       Allergies as of 04/07/2021       Reactions   Acetaminophen Nausea And Vomiting   Amoxicillin Swelling   sweats   Lisinopril  Swelling   Lips and throat        Medication List     TAKE these medications    atorvastatin 10 MG tablet Commonly known as: LIPITOR Take 10 mg by mouth every morning.   blood glucose meter kit and supplies Kit Dispense based on patient and insurance preference. Use up to four times daily as directed.   cyclobenzaprine 10 MG  tablet Commonly known as: FLEXERIL Take 1 tablet (10 mg total) by mouth 3 (three) times daily as needed for muscle spasms.   dutasteride 0.5 MG capsule Commonly known as: AVODART Take 0.5 mg by mouth daily.   FLUoxetine 10 MG capsule Commonly known as: PROZAC Take 10 mg by mouth in the morning.   gabapentin 100 MG capsule Commonly known as: Neurontin Take 1-3 capsules (100-300 mg total) by mouth at bedtime. Follow written titration schedule.   gabapentin 300 MG capsule Commonly known as: NEURONTIN Take 300 mg by mouth 3 (three) times daily.   glimepiride 4 MG tablet Commonly known as: Amaryl Take 1 tablet (4 mg total) by mouth every morning.   GNP Calcium 1200 1200-1000 MG-UNIT Chew Chew 1,200 mg by mouth daily with breakfast. Take in combination with vitamin D and magnesium.   hydrochlorothiazide 25 MG tablet Commonly known as: HYDRODIURIL Take 25 mg by mouth daily.   losartan 100 MG tablet Commonly known as: COZAAR Take 100 mg by mouth daily.   Meclizine HCl 25 MG Chew Chew 25 mg by mouth 3 (three) times daily as needed for dizziness.   metFORMIN 500 MG tablet Commonly known as: Glucophage Take 1 tablet (500 mg total) by mouth 2 (two) times daily with a meal.   naproxen 500 MG tablet Commonly known as: NAPROSYN Take 500 mg by mouth 2 (two) times daily with a meal.   pantoprazole 40 MG tablet Commonly known as: PROTONIX Take 40 mg by mouth as needed.   QUEtiapine 25 MG tablet Commonly known as: SEROQUEL Take 25 mg by mouth at bedtime.   sildenafil 100 MG tablet Commonly known as: VIAGRA Take 1 tab 1 one prior ton intercourse   tamsulosin 0.4 MG Caps capsule Commonly known as: FLOMAX Take 0.4 mg by mouth.        Allergies  Allergen Reactions   Acetaminophen Nausea And Vomiting   Amoxicillin Swelling    sweats   Lisinopril Swelling    Lips and throat    Consultations: None   Procedures/Studies: DG Chest 2 View  Result Date:  04/06/2021 CLINICAL DATA:  Cough EXAM: CHEST - 2 VIEW COMPARISON:  08/26/2013 FINDINGS: The heart size and mediastinal contours are within normal limits. Both lungs are clear. The visualized skeletal structures are unremarkable. IMPRESSION: No active cardiopulmonary disease. Electronically Signed   By: Ulyses Jarred M.D.   On: 04/06/2021 01:16   CT Head Wo Contrast  Result Date: 04/06/2021 CLINICAL DATA:  Dizziness and dehydration. EXAM: CT HEAD WITHOUT CONTRAST TECHNIQUE: Contiguous axial images were obtained from the base of the skull through the vertex without intravenous contrast. COMPARISON:  Head CT 01/13/2013 FINDINGS: Brain: There is mild cerebral atrophy and small vessel disease, small stable bifrontal subdural hygromas. There are small chronic lacunar infarcts in both external capsules. The brainstem and cerebellum are unremarkable. No asymmetry is seen concerning for an acute infarct, hemorrhage or mass. There is no midline shift. Vascular: There calcifications in the siphons and the distal vertebral arteries, but no hyperdense central vessels. Skull: There are bifrontal burr holes but  no visible underlying encephalomalacia. The calvarium is otherwise unremarkable Sinuses/Orbits: Small retention cyst or polyp noted laterally in the right frontal sinus, unchanged. There is scattered membrane thickening in the ethmoids. Other visible sinuses and mastoid aircells are clear. Visualized orbits are unremarkable. Other: None. IMPRESSION: 1. No acute intracranial CT findings or interval changes. 2. Chronic findings discussed above. Electronically Signed   By: Telford Nab M.D.   On: 04/06/2021 03:58      Subjective: Seen and examined at the time of discharge.  Patient stable no distress.  Tolerating p.o. intake.  Dizziness resolved.  Stable for discharge home  Discharge Exam: Vitals:   04/07/21 0326 04/07/21 0900  BP: (!) 138/92 137/73  Pulse: 90 91  Resp: 17   Temp: (!) 97.1 F (36.2 C) 98.4  F (36.9 C)  SpO2: 98% 98%   Vitals:   04/06/21 1647 04/06/21 1956 04/07/21 0326 04/07/21 0900  BP: (!) 147/86 (!) 155/92 (!) 138/92 137/73  Pulse: 76 (!) 58 90 91  Resp: $Remo'20 17 17   'OUXKP$ Temp: 97.6 F (36.4 C) 98.2 F (36.8 C) (!) 97.1 F (36.2 C) 98.4 F (36.9 C)  TempSrc: Oral   Oral  SpO2: 99%  98% 98%  Weight:      Height:        General: Pt is alert, awake, not in acute distress Cardiovascular: RRR, S1/S2 +, no rubs, no gallops Respiratory: CTA bilaterally, no wheezing, no rhonchi Abdominal: Soft, NT, ND, bowel sounds + Extremities: no edema, no cyanosis    The results of significant diagnostics from this hospitalization (including imaging, microbiology, ancillary and laboratory) are listed below for reference.     Microbiology: Recent Results (from the past 240 hour(s))  Resp Panel by RT-PCR (Flu A&B, Covid) Nasopharyngeal Swab     Status: None   Collection Time: 04/06/21  1:02 AM   Specimen: Nasopharyngeal Swab; Nasopharyngeal(NP) swabs in vial transport medium  Result Value Ref Range Status   SARS Coronavirus 2 by RT PCR NEGATIVE NEGATIVE Final    Comment: (NOTE) SARS-CoV-2 target nucleic acids are NOT DETECTED.  The SARS-CoV-2 RNA is generally detectable in upper respiratory specimens during the acute phase of infection. The lowest concentration of SARS-CoV-2 viral copies this assay can detect is 138 copies/mL. A negative result does not preclude SARS-Cov-2 infection and should not be used as the sole basis for treatment or other patient management decisions. A negative result may occur with  improper specimen collection/handling, submission of specimen other than nasopharyngeal swab, presence of viral mutation(s) within the areas targeted by this assay, and inadequate number of viral copies(<138 copies/mL). A negative result must be combined with clinical observations, patient history, and epidemiological information. The expected result is Negative.  Fact  Sheet for Patients:  EntrepreneurPulse.com.au  Fact Sheet for Healthcare Providers:  IncredibleEmployment.be  This test is no t yet approved or cleared by the [place] FDA and  has been authorized for detection and/or diagnosis of SARS-CoV-2 by FDA under an Emergency Use Authorization (EUA). This EUA will remain  in effect (meaning this test can be used) for the duration of the COVID-19 declaration under Section 564(b)(1) of the Act, 21 U.S.C.section 360bbb-3(b)(1), unless the authorization is terminated  or revoked sooner.       Influenza A by PCR NEGATIVE NEGATIVE Final   Influenza B by PCR NEGATIVE NEGATIVE Final    Comment: (NOTE) The Xpert Xpress SARS-CoV-2/FLU/RSV plus assay is intended as an aid in the diagnosis of influenza from  Nasopharyngeal swab specimens and should not be used as a sole basis for treatment. Nasal washings and aspirates are unacceptable for Xpert Xpress SARS-CoV-2/FLU/RSV testing.  Fact Sheet for Patients: EntrepreneurPulse.com.au  Fact Sheet for Healthcare Providers: IncredibleEmployment.be  This test is not yet approved or cleared by the [place] FDA and has been authorized for detection and/or diagnosis of SARS-CoV-2 by FDA under an Emergency Use Authorization (EUA). This EUA will remain in effect (meaning this test can be used) for the duration of the COVID-19 declaration under Section 564(b)(1) of the Act, 21 U.S.C. section 360bbb-3(b)(1), unless the authorization is terminated or revoked.  Performed at [facility], [address] [place], [place] [address]      Labs: BNP (last 3 results) No results for input(s): BNP in the last 8760 hours. Basic Metabolic Panel: Recent Labs  Lab 04/06/21 1309 04/06/21 1638 04/06/21 2133 04/07/21 0103 04/07/21 0422  NA 139 136 135 136 136  K 4.4 4.2 3.9 3.8 3.9  CL 103 103 102 103 103  CO2 $Re'27 24 26 24  24  'IOx$ GLUCOSE 242* 293* 284* 254* 264*  BUN 24* 25* 26* 23 21  CREATININE 1.19 1.12 1.07 1.06 1.10  CALCIUM 9.2 8.9 8.9 8.8* 8.8*   Liver Function Tests: No results for input(s): AST, ALT, ALKPHOS, BILITOT, PROT, ALBUMIN in the last 168 hours. No results for input(s): LIPASE, AMYLASE in the last 168 hours. No results for input(s): AMMONIA in the last 168 hours. CBC: Recent Labs  Lab 04/05/21 2229 04/07/21 0422  WBC 8.9 6.3  HGB 15.6 13.5  HCT 42.3 39.4  MCV 87.8 88.5  PLT 190 153   Cardiac Enzymes: No results for input(s): CKTOTAL, CKMB, CKMBINDEX, TROPONINI in the last 168 hours. BNP: Invalid input(s): POCBNP CBG: Recent Labs  Lab 04/06/21 1305 04/06/21 1401 04/06/21 1740 04/06/21 1953 04/07/21 0910  GLUCAP 232* 242* 317* 340* 338*   D-Dimer No results for input(s): DDIMER in the last 72 hours. Hgb A1c Recent Labs    04/05/21 2229  HGBA1C 9.4*   Lipid Profile No results for input(s): CHOL, HDL, LDLCALC, TRIG, CHOLHDL, LDLDIRECT in the last 72 hours. Thyroid function studies No results for input(s): TSH, T4TOTAL, T3FREE, THYROIDAB in the last 72 hours.  Invalid input(s): FREET3 Anemia work up No results for input(s): VITAMINB12, FOLATE, FERRITIN, TIBC, IRON, RETICCTPCT in the last 72 hours. Urinalysis    Component Value Date/Time   COLORURINE STRAW (A) 04/05/2021 2229   APPEARANCEUR CLEAR (A) 04/05/2021 2229   APPEARANCEUR Clear 04/09/2013 0915   LABSPEC 1.028 04/05/2021 2229   LABSPEC 1.026 04/09/2013 0915   PHURINE 5.0 04/05/2021 2229   GLUCOSEU >=500 (A) 04/05/2021 2229   GLUCOSEU Negative 04/09/2013 0915   HGBUR SMALL (A) 04/05/2021 2229   BILIRUBINUR NEGATIVE 04/05/2021 2229   BILIRUBINUR Negative 04/09/2013 0915   KETONESUR 20 (A) 04/05/2021 2229   PROTEINUR NEGATIVE 04/05/2021 2229   NITRITE NEGATIVE 04/05/2021 2229   LEUKOCYTESUR NEGATIVE 04/05/2021 2229   LEUKOCYTESUR Negative 04/09/2013 0915   Sepsis Labs Invalid input(s): PROCALCITONIN,   WBC,  LACTICIDVEN Microbiology Recent Results (from the past 240 hour(s))  Resp Panel by RT-PCR (Flu A&B, Covid) Nasopharyngeal Swab     Status: None   Collection Time: 04/06/21  1:02 AM   Specimen: Nasopharyngeal Swab; Nasopharyngeal(NP) swabs in vial transport medium  Result Value Ref Range Status   SARS Coronavirus 2 by RT PCR NEGATIVE NEGATIVE Final    Comment: (NOTE) SARS-CoV-2 target nucleic acids are NOT DETECTED.  The SARS-CoV-2 RNA is generally detectable in upper respiratory specimens during the acute phase of infection. The lowest concentration of SARS-CoV-2 viral copies this assay can detect is 138 copies/mL. A negative result does not preclude SARS-Cov-2 infection and should not be used as the sole basis for treatment or other patient management decisions. A negative result may occur with  improper specimen collection/handling, submission of specimen other than nasopharyngeal swab, presence of viral mutation(s) within the areas targeted by this assay, and inadequate number of viral copies(<138 copies/mL). A negative result must be combined with clinical observations, patient history, and epidemiological information. The expected result is Negative.  Fact Sheet for Patients:  EntrepreneurPulse.com.au  Fact Sheet for Healthcare Providers:  IncredibleEmployment.be  This test is no t yet approved or cleared by the [place] FDA and  has been authorized for detection and/or diagnosis of SARS-CoV-2 by FDA under an Emergency Use Authorization (EUA). This EUA will remain  in effect (meaning this test can be used) for the duration of the COVID-19 declaration under Section 564(b)(1) of the Act, 21 U.S.C.section 360bbb-3(b)(1), unless the authorization is terminated  or revoked sooner.       Influenza A by PCR NEGATIVE NEGATIVE Final   Influenza B by PCR NEGATIVE NEGATIVE Final    Comment: (NOTE) The Xpert Xpress SARS-CoV-2/FLU/RSV  plus assay is intended as an aid in the diagnosis of influenza from Nasopharyngeal swab specimens and should not be used as a sole basis for treatment. Nasal washings and aspirates are unacceptable for Xpert Xpress SARS-CoV-2/FLU/RSV testing.  Fact Sheet for Patients: EntrepreneurPulse.com.au  Fact Sheet for Healthcare Providers: IncredibleEmployment.be  This test is not yet approved or cleared by the [place] FDA and has been authorized for detection and/or diagnosis of SARS-CoV-2 by FDA under an Emergency Use Authorization (EUA). This EUA will remain in effect (meaning this test can be used) for the duration of the COVID-19 declaration under Section 564(b)(1) of the Act, 21 U.S.C. section 360bbb-3(b)(1), unless the authorization is terminated or revoked.  Performed at [facility], [address], [place], [place] [address]      Time coordinating discharge: Over 30 minutes  SIGNED:   Sidney Ace, MD  [facility] 04/07/2021, 12:02 PM Pager   If 7PM-7AM, please contact night-coverage

## 2021-04-09 ENCOUNTER — Emergency Department
Admission: EM | Admit: 2021-04-09 | Discharge: 2021-04-09 | Discharge status: Against Medical Advice | Payer: Medicare HMO

## 2021-04-09 DIAGNOSIS — I10 Essential (primary) hypertension: Secondary | ICD-10-CM | POA: Diagnosis not present

## 2021-04-09 DIAGNOSIS — Z7984 Long term (current) use of oral hypoglycemic drugs: Secondary | ICD-10-CM | POA: Diagnosis not present

## 2021-04-09 DIAGNOSIS — R739 Hyperglycemia, unspecified: Secondary | ICD-10-CM | POA: Diagnosis not present

## 2021-04-09 DIAGNOSIS — Z87891 Personal history of nicotine dependence: Secondary | ICD-10-CM | POA: Diagnosis not present

## 2021-04-09 DIAGNOSIS — R42 Dizziness and giddiness: Secondary | ICD-10-CM | POA: Diagnosis not present

## 2021-04-09 DIAGNOSIS — E1165 Type 2 diabetes mellitus with hyperglycemia: Secondary | ICD-10-CM | POA: Diagnosis not present

## 2021-04-09 DIAGNOSIS — H539 Unspecified visual disturbance: Secondary | ICD-10-CM | POA: Diagnosis not present

## 2021-04-09 DIAGNOSIS — M1990 Unspecified osteoarthritis, unspecified site: Secondary | ICD-10-CM | POA: Diagnosis not present

## 2021-04-09 DIAGNOSIS — Z79899 Other long term (current) drug therapy: Secondary | ICD-10-CM | POA: Diagnosis not present

## 2021-04-09 DIAGNOSIS — Z881 Allergy status to other antibiotic agents status: Secondary | ICD-10-CM | POA: Diagnosis not present

## 2021-04-09 NOTE — ED Triage Notes (Signed)
Pt called for triage, no response. 

## 2021-04-09 NOTE — ED Triage Notes (Signed)
Pt called x's 3 for triage, no response 

## 2021-04-14 ENCOUNTER — Other Ambulatory Visit: Payer: Self-pay

## 2021-04-14 ENCOUNTER — Emergency Department (HOSPITAL_COMMUNITY)
Admission: EM | Admit: 2021-04-14 | Discharge: 2021-04-14 | Disposition: A | Discharge status: Home / Self Care | Payer: No Typology Code available for payment source | Attending: Emergency Medicine | Admitting: Emergency Medicine

## 2021-04-14 DIAGNOSIS — R42 Dizziness and giddiness: Secondary | ICD-10-CM | POA: Diagnosis not present

## 2021-04-14 DIAGNOSIS — Z5321 Procedure and treatment not carried out due to patient leaving prior to being seen by health care provider: Secondary | ICD-10-CM | POA: Insufficient documentation

## 2021-04-14 DIAGNOSIS — R739 Hyperglycemia, unspecified: Secondary | ICD-10-CM | POA: Insufficient documentation

## 2021-04-14 DIAGNOSIS — I959 Hypotension, unspecified: Secondary | ICD-10-CM | POA: Diagnosis not present

## 2021-04-14 DIAGNOSIS — W19XXXA Unspecified fall, initial encounter: Secondary | ICD-10-CM | POA: Diagnosis not present

## 2021-04-14 LAB — CBG MONITORING, ED: Glucose-Capillary: 414 mg/dL — ABNORMAL HIGH (ref 70–99)

## 2021-04-14 NOTE — ED Notes (Signed)
Pt stated "Last name Brasen, Bundren leaving I am going to [facility]" and walked out of ED.

## 2021-04-14 NOTE — ED Triage Notes (Signed)
Fall this AM with c/o dizziness prior to fall no LOC no injuries...went to the [place] pt was hyperglycemic with BP 55/40 they sent to [facility] where he was placed in triage pt left went back to the [place] when the [place] called EMS to transport to our ER. Currently SBP 102, CBG 465

## 2021-04-27 DIAGNOSIS — M7581 Other shoulder lesions, right shoulder: Secondary | ICD-10-CM | POA: Diagnosis not present

## 2021-04-27 DIAGNOSIS — I10 Essential (primary) hypertension: Secondary | ICD-10-CM | POA: Diagnosis not present

## 2021-04-27 DIAGNOSIS — E1169 Type 2 diabetes mellitus with other specified complication: Secondary | ICD-10-CM | POA: Diagnosis not present

## 2021-04-27 DIAGNOSIS — E785 Hyperlipidemia, unspecified: Secondary | ICD-10-CM | POA: Diagnosis not present

## 2021-04-27 DIAGNOSIS — M13862 Other specified arthritis, left knee: Secondary | ICD-10-CM | POA: Diagnosis not present

## 2021-04-27 DIAGNOSIS — K219 Gastro-esophageal reflux disease without esophagitis: Secondary | ICD-10-CM | POA: Diagnosis not present

## 2021-04-27 DIAGNOSIS — E1122 Type 2 diabetes mellitus with diabetic chronic kidney disease: Secondary | ICD-10-CM | POA: Diagnosis not present

## 2021-04-27 DIAGNOSIS — F17200 Nicotine dependence, unspecified, uncomplicated: Secondary | ICD-10-CM | POA: Diagnosis not present

## 2021-04-27 DIAGNOSIS — F5221 Male erectile disorder: Secondary | ICD-10-CM | POA: Diagnosis not present

## 2021-05-25 DIAGNOSIS — E0822 Diabetes mellitus due to underlying condition with diabetic chronic kidney disease: Secondary | ICD-10-CM | POA: Diagnosis not present

## 2021-05-25 DIAGNOSIS — E0829 Diabetes mellitus due to underlying condition with other diabetic kidney complication: Secondary | ICD-10-CM | POA: Diagnosis not present

## 2021-05-25 DIAGNOSIS — E1122 Type 2 diabetes mellitus with diabetic chronic kidney disease: Secondary | ICD-10-CM | POA: Diagnosis not present

## 2021-06-10 DIAGNOSIS — M7581 Other shoulder lesions, right shoulder: Secondary | ICD-10-CM | POA: Diagnosis not present

## 2021-06-10 DIAGNOSIS — F5221 Male erectile disorder: Secondary | ICD-10-CM | POA: Diagnosis not present

## 2021-06-10 DIAGNOSIS — I10 Essential (primary) hypertension: Secondary | ICD-10-CM | POA: Diagnosis not present

## 2021-06-10 DIAGNOSIS — G4733 Obstructive sleep apnea (adult) (pediatric): Secondary | ICD-10-CM | POA: Diagnosis not present

## 2021-06-10 DIAGNOSIS — Z1331 Encounter for screening for depression: Secondary | ICD-10-CM | POA: Diagnosis not present

## 2021-06-10 DIAGNOSIS — M13862 Other specified arthritis, left knee: Secondary | ICD-10-CM | POA: Diagnosis not present

## 2021-06-10 DIAGNOSIS — E785 Hyperlipidemia, unspecified: Secondary | ICD-10-CM | POA: Diagnosis not present

## 2021-06-10 DIAGNOSIS — F17200 Nicotine dependence, unspecified, uncomplicated: Secondary | ICD-10-CM | POA: Diagnosis not present

## 2021-06-10 DIAGNOSIS — E1169 Type 2 diabetes mellitus with other specified complication: Secondary | ICD-10-CM | POA: Diagnosis not present

## 2021-07-21 DIAGNOSIS — F5221 Male erectile disorder: Secondary | ICD-10-CM | POA: Diagnosis not present

## 2021-07-21 DIAGNOSIS — E1169 Type 2 diabetes mellitus with other specified complication: Secondary | ICD-10-CM | POA: Diagnosis not present

## 2021-07-21 DIAGNOSIS — I10 Essential (primary) hypertension: Secondary | ICD-10-CM | POA: Diagnosis not present

## 2021-07-22 DIAGNOSIS — G4733 Obstructive sleep apnea (adult) (pediatric): Secondary | ICD-10-CM | POA: Diagnosis not present

## 2021-07-22 DIAGNOSIS — F5221 Male erectile disorder: Secondary | ICD-10-CM | POA: Diagnosis not present

## 2021-07-22 DIAGNOSIS — B081 Molluscum contagiosum: Secondary | ICD-10-CM | POA: Diagnosis not present

## 2021-07-22 DIAGNOSIS — E1169 Type 2 diabetes mellitus with other specified complication: Secondary | ICD-10-CM | POA: Diagnosis not present

## 2021-07-22 DIAGNOSIS — M7581 Other shoulder lesions, right shoulder: Secondary | ICD-10-CM | POA: Diagnosis not present

## 2021-07-22 DIAGNOSIS — Z1331 Encounter for screening for depression: Secondary | ICD-10-CM | POA: Diagnosis not present

## 2021-07-22 DIAGNOSIS — Z0001 Encounter for general adult medical examination with abnormal findings: Secondary | ICD-10-CM | POA: Diagnosis not present

## 2021-07-22 DIAGNOSIS — E785 Hyperlipidemia, unspecified: Secondary | ICD-10-CM | POA: Diagnosis not present

## 2021-07-22 DIAGNOSIS — I10 Essential (primary) hypertension: Secondary | ICD-10-CM | POA: Diagnosis not present

## 2021-08-05 DIAGNOSIS — G8929 Other chronic pain: Secondary | ICD-10-CM | POA: Diagnosis not present

## 2021-08-05 DIAGNOSIS — M542 Cervicalgia: Secondary | ICD-10-CM | POA: Diagnosis not present

## 2021-08-05 DIAGNOSIS — M545 Low back pain, unspecified: Secondary | ICD-10-CM | POA: Diagnosis not present

## 2021-08-05 DIAGNOSIS — Z79899 Other long term (current) drug therapy: Secondary | ICD-10-CM | POA: Diagnosis not present

## 2021-08-05 DIAGNOSIS — M25512 Pain in left shoulder: Secondary | ICD-10-CM | POA: Diagnosis not present

## 2021-09-28 DIAGNOSIS — E1122 Type 2 diabetes mellitus with diabetic chronic kidney disease: Secondary | ICD-10-CM | POA: Diagnosis not present

## 2021-09-28 DIAGNOSIS — E1169 Type 2 diabetes mellitus with other specified complication: Secondary | ICD-10-CM | POA: Diagnosis not present

## 2021-10-12 DIAGNOSIS — F17200 Nicotine dependence, unspecified, uncomplicated: Secondary | ICD-10-CM | POA: Diagnosis not present

## 2021-10-12 DIAGNOSIS — N183 Chronic kidney disease, stage 3 unspecified: Secondary | ICD-10-CM | POA: Diagnosis not present

## 2021-10-12 DIAGNOSIS — E785 Hyperlipidemia, unspecified: Secondary | ICD-10-CM | POA: Diagnosis not present

## 2021-10-12 DIAGNOSIS — G4733 Obstructive sleep apnea (adult) (pediatric): Secondary | ICD-10-CM | POA: Diagnosis not present

## 2021-10-12 DIAGNOSIS — F5221 Male erectile disorder: Secondary | ICD-10-CM | POA: Diagnosis not present

## 2021-10-12 DIAGNOSIS — E1169 Type 2 diabetes mellitus with other specified complication: Secondary | ICD-10-CM | POA: Diagnosis not present

## 2021-10-12 DIAGNOSIS — M7581 Other shoulder lesions, right shoulder: Secondary | ICD-10-CM | POA: Diagnosis not present

## 2021-10-12 DIAGNOSIS — B081 Molluscum contagiosum: Secondary | ICD-10-CM | POA: Diagnosis not present

## 2021-10-12 DIAGNOSIS — I10 Essential (primary) hypertension: Secondary | ICD-10-CM | POA: Diagnosis not present

## 2021-10-12 DIAGNOSIS — E870 Hyperosmolality and hypernatremia: Secondary | ICD-10-CM | POA: Diagnosis not present

## 2021-11-15 DIAGNOSIS — M5451 Vertebrogenic low back pain: Secondary | ICD-10-CM | POA: Diagnosis not present

## 2021-12-05 DIAGNOSIS — M5451 Vertebrogenic low back pain: Secondary | ICD-10-CM | POA: Diagnosis not present

## 2022-01-03 ENCOUNTER — Ambulatory Visit (HOSPITAL_BASED_OUTPATIENT_CLINIC_OR_DEPARTMENT_OTHER): Payer: No Typology Code available for payment source | Admitting: Rehabilitative and Restorative Service Providers"

## 2022-01-17 ENCOUNTER — Ambulatory Visit (HOSPITAL_BASED_OUTPATIENT_CLINIC_OR_DEPARTMENT_OTHER): Payer: No Typology Code available for payment source | Admitting: Physical Therapy

## 2022-01-17 ENCOUNTER — Other Ambulatory Visit: Payer: Self-pay

## 2022-01-17 NOTE — Patient Outreach (Signed)
[facility]) Care Management  01/17/2022  Jonell Krontz Feb 25, 1956 [id number]   Telephone Screen    Outreach call to patient to introduce [facility] services and assess care needs as part of benefit of PCP office and insurance plan.No answer. RN CM left HIPAA compliant voicemail message along with contact info.      Plan: RN CM will make outreach attempt to patient within 4 business days.   Antionette Fairy, RN,BSN,CCM [facility] Care Management Telephonic Care Management Coordinator Direct Phone: [phone] Toll Free: [phone] Fax: [phone]

## 2022-01-17 NOTE — Patient Outreach (Signed)
[facility]) Care Management  01/17/2022  Deni Lefever 04-Jan-1956 [id number]   Telephone Screen    Voicemail received from patient. Return call placed to patient.[facility] services reviewed and assesses care needs as part of benefit of PCP office and insurance plan. Patient has [employer], Medicaid and VA benefits. Reviewed benefits and CM services available to patient through insurance coverage.   Main healthcare issue/concern today: Patient states he has been dealing with some back pain for some time.he is seeing specialist. He ash been prescribed meds to help with pain relief. Patient drives himself to medial appts but admits that gas prices are too high for him to ride much Reviewed and discussed transportation benefits available to patient through insurance providers and encouraged him to consider taking advantage of. He voiced understanding.   Health Maintenance/Care Gaps: -Last AWV: 07/22/21 per EMR and K[facility].    Antionette Fairy, RN,BSN,CCM [facility] Care Management Telephonic Care Management Coordinator Direct Phone: [phone] Toll Free: [phone] Fax: [phone]

## 2022-02-27 DIAGNOSIS — E1169 Type 2 diabetes mellitus with other specified complication: Secondary | ICD-10-CM | POA: Diagnosis not present

## 2022-02-27 DIAGNOSIS — E870 Hyperosmolality and hypernatremia: Secondary | ICD-10-CM | POA: Diagnosis not present

## 2022-03-24 DIAGNOSIS — G4733 Obstructive sleep apnea (adult) (pediatric): Secondary | ICD-10-CM | POA: Diagnosis not present

## 2022-03-24 DIAGNOSIS — Z23 Encounter for immunization: Secondary | ICD-10-CM | POA: Diagnosis not present

## 2022-03-24 DIAGNOSIS — I10 Essential (primary) hypertension: Secondary | ICD-10-CM | POA: Diagnosis not present

## 2022-03-24 DIAGNOSIS — F17200 Nicotine dependence, unspecified, uncomplicated: Secondary | ICD-10-CM | POA: Diagnosis not present

## 2022-03-24 DIAGNOSIS — B081 Molluscum contagiosum: Secondary | ICD-10-CM | POA: Diagnosis not present

## 2022-03-24 DIAGNOSIS — M13862 Other specified arthritis, left knee: Secondary | ICD-10-CM | POA: Diagnosis not present

## 2022-03-24 DIAGNOSIS — E785 Hyperlipidemia, unspecified: Secondary | ICD-10-CM | POA: Diagnosis not present

## 2022-03-24 DIAGNOSIS — M7581 Other shoulder lesions, right shoulder: Secondary | ICD-10-CM | POA: Diagnosis not present

## 2022-03-24 DIAGNOSIS — E1169 Type 2 diabetes mellitus with other specified complication: Secondary | ICD-10-CM | POA: Diagnosis not present

## 2022-03-24 DIAGNOSIS — F5221 Male erectile disorder: Secondary | ICD-10-CM | POA: Diagnosis not present

## 2022-04-23 DIAGNOSIS — E1165 Type 2 diabetes mellitus with hyperglycemia: Secondary | ICD-10-CM | POA: Diagnosis not present

## 2022-04-23 DIAGNOSIS — R739 Hyperglycemia, unspecified: Secondary | ICD-10-CM | POA: Diagnosis not present

## 2022-04-23 LAB — CBG MONITORING, ED: Glucose-Capillary: >600 mg/dL (ref 70–99)

## 2022-04-23 LAB — CBC WITH DIFFERENTIAL/PLATELET
Abs Immature Granulocytes: 0.01 10*3/uL (ref 0.00–0.07)
Basophils Absolute: 0.1 10*3/uL (ref 0.0–0.1)
Basophils Relative: 1 %
Eosinophils Absolute: 0.1 10*3/uL (ref 0.0–0.5)
Eosinophils Relative: 1 %
HCT: 46 % (ref 39.0–52.0)
Hemoglobin: 16 g/dL (ref 13.0–17.0)
Immature Granulocytes: 0 %
Lymphocytes Relative: 25 %
Lymphs Abs: 1.6 10*3/uL (ref 0.7–4.0)
MCH: 29.4 pg (ref 26.0–34.0)
MCHC: 34.8 g/dL (ref 30.0–36.0)
MCV: 84.4 fL (ref 80.0–100.0)
Monocytes Absolute: 0.3 10*3/uL (ref 0.1–1.0)
Monocytes Relative: 5 %
Neutro Abs: 4.5 10*3/uL (ref 1.7–7.7)
Neutrophils Relative %: 68 %
Platelets: 200 10*3/uL (ref 150–400)
RBC: 5.45 MIL/uL (ref 4.22–5.81)
RDW: 12 % (ref 11.5–15.5)
WBC: 6.6 10*3/uL (ref 4.0–10.5)
nRBC: 0 % (ref 0.0–0.2)

## 2022-04-23 MED ORDER — SODIUM CHLORIDE 0.9 % IV BOLUS
1000.0000 mL | Freq: Once | INTRAVENOUS | Status: AC
  Administered 2022-04-23: 1000 mL via INTRAVENOUS

## 2022-04-23 NOTE — ED Triage Notes (Signed)
Pt presents via POV with complaints of hyperglycemia that started 2 weeks ago. Getting High glucose readings at home, T2DM. Discussed with his PCP who told him to "keep an eye on it" and take his medications which he has been adhering too. Endorses polyuria and fatigue. Denies CP or SOB.

## 2022-04-24 ENCOUNTER — Emergency Department
Admission: EM | Admit: 2022-04-24 | Discharge: 2022-04-24 | Disposition: A | Discharge status: Home / Self Care | Payer: Medicare HMO | Attending: Emergency Medicine | Admitting: Emergency Medicine

## 2022-04-24 DIAGNOSIS — R739 Hyperglycemia, unspecified: Secondary | ICD-10-CM | POA: Diagnosis not present

## 2022-04-24 LAB — COMPREHENSIVE METABOLIC PANEL
ALT: 26 U/L (ref 0–44)
AST: 20 U/L (ref 15–41)
Albumin: 3.5 g/dL (ref 3.5–5.0)
Alkaline Phosphatase: 194 U/L — ABNORMAL HIGH (ref 38–126)
Anion gap: 11 (ref 5–15)
BUN: 24 mg/dL — ABNORMAL HIGH (ref 8–23)
CO2: 22 mmol/L (ref 22–32)
Calcium: 9 mg/dL (ref 8.9–10.3)
Chloride: 99 mmol/L (ref 98–111)
Creatinine, Ser: 1.44 mg/dL — ABNORMAL HIGH (ref 0.61–1.24)
GFR, Estimated: 54 mL/min — ABNORMAL LOW (ref >60)
Glucose, Bld: 728 mg/dL (ref 70–99)
Potassium: 4.9 mmol/L (ref 3.5–5.1)
Sodium: 132 mmol/L — ABNORMAL LOW (ref 135–145)
Total Bilirubin: 1 mg/dL (ref 0.3–1.2)
Total Protein: 7 g/dL (ref 6.5–8.1)

## 2022-04-24 LAB — CBG MONITORING, ED
Glucose-Capillary: 367 mg/dL — ABNORMAL HIGH (ref 70–99)
Glucose-Capillary: 390 mg/dL — ABNORMAL HIGH (ref 70–99)
Glucose-Capillary: 463 mg/dL — ABNORMAL HIGH (ref 70–99)
Glucose-Capillary: 582 mg/dL (ref 70–99)

## 2022-04-24 LAB — BASIC METABOLIC PANEL
Anion gap: 7 (ref 5–15)
BUN: 19 mg/dL (ref 8–23)
CO2: 22 mmol/L (ref 22–32)
Calcium: 8.3 mg/dL — ABNORMAL LOW (ref 8.9–10.3)
Chloride: 109 mmol/L (ref 98–111)
Creatinine, Ser: 1.05 mg/dL (ref 0.61–1.24)
GFR, Estimated: >60 mL/min (ref >60)
Glucose, Bld: 385 mg/dL — ABNORMAL HIGH (ref 70–99)
Potassium: 4.1 mmol/L (ref 3.5–5.1)
Sodium: 138 mmol/L (ref 135–145)

## 2022-04-24 LAB — BLOOD GAS, VENOUS
Acid-base deficit: 2.2 mmol/L — ABNORMAL HIGH (ref 0.0–2.0)
Bicarbonate: 22.4 mmol/L (ref 20.0–28.0)
O2 Saturation: 90.1 %
Patient temperature: 37
pCO2, Ven: 37 mmHg — ABNORMAL LOW (ref 44–60)
pH, Ven: 7.39 (ref 7.25–7.43)
pO2, Ven: 56 mmHg — ABNORMAL HIGH (ref 32–45)

## 2022-04-24 MED ORDER — METFORMIN HCL 500 MG PO TABS: 500.0000 mg | ORAL_TABLET | Freq: Two times a day (BID) | ORAL | 3 refills | Status: DC

## 2022-04-24 MED ORDER — LACTATED RINGERS IV BOLUS
1000.0000 mL | Freq: Once | INTRAVENOUS | Status: AC
  Administered 2022-04-24: 1000 mL via INTRAVENOUS

## 2022-04-24 MED ORDER — INSULIN ASPART 100 UNIT/ML IJ SOLN
5.0000 [IU] | Freq: Once | INTRAMUSCULAR | Status: AC
  Administered 2022-04-24: 5 [IU] via INTRAVENOUS
  Filled 2022-04-24: qty 1

## 2022-04-24 MED ORDER — METFORMIN HCL 500 MG PO TABS
500.0000 mg | ORAL_TABLET | Freq: Once | ORAL | Status: AC
  Administered 2022-04-24: 500 mg via ORAL
  Filled 2022-04-24: qty 1

## 2022-04-24 NOTE — Discharge Instructions (Addendum)
Please check your fingersticks 4 times a day before meals and before bed for the next 2 days.  Write down the results for the next 2 days please see your doctor in the next 1 to 2 days.  If you can see him this afternoon that would be great.  I am going to restart the metformin for you.  1 pill twice a day.  We will give you the first dose here in the emergency room before you leave.  It may be that your doctor will restart the Amaryl as well.  If you have not been able to see your doctor and your sugar goes above 400 please return here.

## 2022-04-24 NOTE — ED Provider Notes (Signed)
[facility] Provider Note    Event Date/Time   First MD Initiated Contact with Patient 04/24/22 0034     (approximate)   History   Hyperglycemia   HPI  Gabriel Russell is a 66 y.o. male who reports he has been very tired lately drinking liquids all the time and urinating all the time.  His sugars been climbing slowly and today it was high so he came into the hospital.  He was taken off his metformin sometime ago and is now on semaglutide.  He is not taking any other medications for his diabetes.  Patient is not coughing or short of breath he is not running a fever he does not have a sore throat he does not have a bellyache or dysuria.      Physical Exam   Triage Vital Signs: ED Triage Vitals  Enc Vitals Group     BP 04/23/22 2345 (!) 114/90     Pulse Rate 04/23/22 2345 (!) 102     Resp 04/23/22 2345 18     Temp 04/23/22 2345 98 F (36.7 C)     Temp Source 04/23/22 2345 Oral     SpO2 04/23/22 2345 98 %     Weight 04/23/22 2346 255 lb 11.7 oz (116 kg)     Height 04/23/22 2346 6\' 3"  (1.905 m)     Head Circumference --      Peak Flow --      Pain Score 04/23/22 2346 0     Pain Loc --      Pain Edu? --      Excl. in [place]? --     Most recent vital signs: Vitals:   04/24/22 0330 04/24/22 0400  BP: 108/69 114/74  Pulse: 65 67  Resp: 16 18  Temp: 98.2 F (36.8 C)   SpO2: 98% 96%     General: Awake, no distress.  CV:  Good peripheral perfusion.  Heart regular rate and rhythm no audible murmurs Resp:  Normal effort.  Lungs are clear Abd:  No distention.  Soft and nontender Extremities: No edema   ED Results / Procedures / Treatments   Labs (all labs ordered are listed, but only abnormal results are displayed) Labs Reviewed  COMPREHENSIVE METABOLIC PANEL - Abnormal; Notable for the following components:      Result Value   Sodium 132 (*)    Glucose, Bld 728 (*)    BUN 24 (*)    Creatinine, Ser 1.44 (*)    Alkaline Phosphatase 194 (*)     GFR, Estimated 54 (*)    All other components within normal limits  BLOOD GAS, VENOUS - Abnormal; Notable for the following components:   pCO2, Ven 37 (*)    pO2, Ven 56 (*)    Acid-base deficit 2.2 (*)    All other components within normal limits  BASIC METABOLIC PANEL - Abnormal; Notable for the following components:   Glucose, Bld 385 (*)    Calcium 8.3 (*)    All other components within normal limits  CBG MONITORING, ED - Abnormal; Notable for the following components:   Glucose-Capillary >600 (*)    All other components within normal limits  CBG MONITORING, ED - Abnormal; Notable for the following components:   Glucose-Capillary 582 (*)    All other components within normal limits  CBG MONITORING, ED - Abnormal; Notable for the following components:   Glucose-Capillary 463 (*)    All other components within normal limits  CBG MONITORING, ED - Abnormal; Notable for the following components:   Glucose-Capillary 390 (*)    All other components within normal limits  CBC WITH DIFFERENTIAL/PLATELET  CBG MONITORING, ED     EKG     RADIOLOGY    PROCEDURES:  Critical Care performed:   Procedures   MEDICATIONS ORDERED IN ED: Medications  sodium chloride 0.9 % bolus 1,000 mL (0 mLs Intravenous Stopped 04/24/22 0045)  lactated ringers bolus 1,000 mL (0 mLs Intravenous Stopped 04/24/22 0201)  insulin aspart (novoLOG) injection 5 Units (5 Units Intravenous Given 04/24/22 0058)     IMPRESSION / MDM / ASSESSMENT AND PLAN / ED COURSE  I reviewed the triage vital signs and the nursing notes. ----------------------------------------- 2:51 AM on 04/24/2022 ----------------------------------------- After some searching I have been able to find some records in the computer.  Patient apparently had a sugar of 158 yesterday outpatient and he has another outpatient record from yesterday that says he is taking metformin although he says he was taken off of it.  I looked through  his medicines and he does not have any metformin he only has the semaglutide.  I will check another BMP and see if his creatinine is improved at least we can get him back on the metformin discussed him briefly with the hospitalist who suggested Amaryl plus or minus metformin.  If his sugar is improved we will be able to discharge him and have him follow-up with his doctor later on today. ----------------------------------------- 4:15 AM on 04/24/2022 ----------------------------------------- Patient's GFR is now normal his creatinine is normal.  Blood glucose is 385.  We will get 1 more CBG and see what this looks like.  Anticipate I will let him go home restart the metformin 500 twice daily in addition to the semaglutide and have him follow-up with his doctor later today.  I will let his doctor restart him on the Amaryl.  I do not want to start too many medicines and have him become hypoglycemic. Differential diagnosis includes, but is not limited to, hyperglycemia.  Patient does not appear to have DKA or hyperglycemic nonketotic coma  Patient's presentation is most consistent with acute presentation with potential threat to life or bodily function.  The patient is on the cardiac monitor to evaluate for evidence of arrhythmia and/or significant heart rate changes.  None have been seen  ----------------------------------------- 4:45 AM on 04/24/2022 ----------------------------------------- Patient's sugar is now 365 I think we can let him go.  I have restarted the metformin and advised him to see his doctor later this afternoon if at all possible.    FINAL CLINICAL IMPRESSION(S) / ED DIAGNOSES   Final diagnoses:  Hyperglycemia     Rx / DC Orders   ED Discharge Orders          Ordered    metFORMIN (GLUCOPHAGE) 500 MG tablet  2 times daily with meals        04/24/22 0425             Note:  This document was prepared using Dragon voice recognition software and may include  unintentional dictation errors.   Arnaldo Natal, MD 04/24/22 [phone]

## 2022-05-09 ENCOUNTER — Telehealth: Payer: Self-pay

## 2022-05-09 NOTE — Telephone Encounter (Signed)
     Patient  visit on 04/24/2022  at [facility] was for Hyperglycemia, unspecified.  Have you been able to follow up with your primary care physician? Yes  The patient was or was not able to obtain any needed medicine or equipment. Patient was able to obtain medication.  Are there diet recommendations that you are having difficulty following? Patient is able to follow diet recommendations with no issue.  Patient expresses understanding of discharge instructions and education provided has no other needs at this time.    [facility]  [facility] Population Health Community Resource Care Guide   [email or web address]  ?? [phone]   Website: [email or web address]  [email or web address]

## 2022-07-14 ENCOUNTER — Other Ambulatory Visit: Payer: Self-pay | Admitting: Internal Medicine

## 2022-07-14 ENCOUNTER — Other Ambulatory Visit: Payer: Medicare HMO

## 2022-07-14 DIAGNOSIS — E785 Hyperlipidemia, unspecified: Secondary | ICD-10-CM | POA: Diagnosis not present

## 2022-07-14 DIAGNOSIS — E1169 Type 2 diabetes mellitus with other specified complication: Secondary | ICD-10-CM | POA: Diagnosis not present

## 2022-07-14 DIAGNOSIS — E1122 Type 2 diabetes mellitus with diabetic chronic kidney disease: Secondary | ICD-10-CM | POA: Diagnosis not present

## 2022-07-15 LAB — COMPREHENSIVE METABOLIC PANEL
ALT: 13 IU/L (ref 0–44)
AST: 15 IU/L (ref 0–40)
Albumin/Globulin Ratio: 1.9 (ref 1.2–2.2)
Albumin: 4 g/dL (ref 3.9–4.9)
Alkaline Phosphatase: 121 IU/L (ref 44–121)
BUN/Creatinine Ratio: 13 (ref 10–24)
BUN: 14 mg/dL (ref 8–27)
Bilirubin Total: 0.4 mg/dL (ref 0.0–1.2)
CO2: 19 mmol/L — ABNORMAL LOW (ref 20–29)
Calcium: 9.5 mg/dL (ref 8.6–10.2)
Chloride: 103 mmol/L (ref 96–106)
Creatinine, Ser: 1.09 mg/dL (ref 0.76–1.27)
Globulin, Total: 2.1 g/dL (ref 1.5–4.5)
Glucose: 214 mg/dL — ABNORMAL HIGH (ref 70–99)
Potassium: 4.2 mmol/L (ref 3.5–5.2)
Sodium: 139 mmol/L (ref 134–144)
Total Protein: 6.1 g/dL (ref 6.0–8.5)
eGFR: 75 mL/min/{1.73_m2} (ref >59)

## 2022-07-15 LAB — LIPID PANEL W/O CHOL/HDL RATIO
Cholesterol, Total: 118 mg/dL (ref 100–199)
HDL: 35 mg/dL — ABNORMAL LOW (ref >39)
LDL Chol Calc (NIH): 65 mg/dL (ref 0–99)
Triglycerides: 91 mg/dL (ref 0–149)
VLDL Cholesterol Cal: 18 mg/dL (ref 5–40)

## 2022-07-15 LAB — CK: Total CK: 77 U/L (ref 41–331)

## 2022-07-15 LAB — HGB A1C W/O EAG: Hgb A1c MFr Bld: 10.5 % — ABNORMAL HIGH (ref 4.8–5.6)

## 2022-07-17 ENCOUNTER — Ambulatory Visit: Payer: Medicare HMO | Admitting: Internal Medicine

## 2022-07-19 ENCOUNTER — Ambulatory Visit (INDEPENDENT_AMBULATORY_CARE_PROVIDER_SITE_OTHER): Payer: Medicare HMO | Admitting: Internal Medicine

## 2022-07-19 ENCOUNTER — Encounter: Payer: Self-pay | Admitting: Internal Medicine

## 2022-07-19 VITALS — BP 100/70 | HR 88 | Ht 75.5 in | Wt 218.6 lb

## 2022-07-19 DIAGNOSIS — I10 Essential (primary) hypertension: Secondary | ICD-10-CM

## 2022-07-19 DIAGNOSIS — Z794 Long term (current) use of insulin: Secondary | ICD-10-CM

## 2022-07-19 DIAGNOSIS — E119 Type 2 diabetes mellitus without complications: Secondary | ICD-10-CM

## 2022-07-19 DIAGNOSIS — N401 Enlarged prostate with lower urinary tract symptoms: Secondary | ICD-10-CM

## 2022-07-19 DIAGNOSIS — E782 Mixed hyperlipidemia: Secondary | ICD-10-CM

## 2022-07-19 DIAGNOSIS — R350 Frequency of micturition: Secondary | ICD-10-CM | POA: Diagnosis not present

## 2022-07-19 DIAGNOSIS — E559 Vitamin D deficiency, unspecified: Secondary | ICD-10-CM

## 2022-07-19 LAB — POC CREATINE & ALBUMIN,URINE
Creatinine, POC: 50 mg/dL
Microalbumin Ur, POC: 30 mg/L

## 2022-07-19 LAB — GLUCOSE, POCT (MANUAL RESULT ENTRY): POC Glucose: 280 mg/dl — AB (ref 70–99)

## 2022-07-19 NOTE — Progress Notes (Signed)
Established Patient Office Visit  Subjective:  Patient ID: Gabriel Russell, male    DOB: 01/18/1956  Age: 67 y.o. MRN: [id number]  Chief Complaint  Patient presents with   Follow-up    Follow up with lab results    No new complaints, here for lab review and medication refills. Labs reviewed and notable for well controlled lipids, unremarkable LFTs but severely uncontrolled diabetes and reports he was just started on Lantus insulin by the [facility]      Past Medical History:  Diagnosis Date   Arthritis    Concussion    GERD (gastroesophageal reflux disease)    OCC   Hyperlipidemia    Hypertension    Sleep apnea    cpap     >3 yrs   Subdural hematoma ([place]) 2014   AFTER A FALL ON ICE    Social History   Socioeconomic History   Marital status: Divorced    Spouse name: Not on file   Number of children: Not on file   Years of education: Not on file   Highest education level: Not on file  Occupational History   Occupation: disability  Tobacco Use   Smoking status: Light Smoker    Years: 15.00    Types: Cigarettes   Smokeless tobacco: Never   Tobacco comments:    2 cigarettes per day  Vaping Use   Vaping Use: Never used  Substance and Sexual Activity   Alcohol use: No   Drug use: No    Comment: pt denies but UDS in 2014 + for marijuana   Sexual activity: Not on file  Other Topics Concern   Not on file  Social History Narrative   Not on file   Social Determinants of Health   Financial Resource Strain: Not on file  Food Insecurity: No Food Insecurity (01/17/2022)   Hunger Vital Sign    Worried About Running Out of Food in the Last Year: Never true    Ran Out of Food in the Last Year: Never true  Transportation Needs: No Transportation Needs (01/17/2022)   PRAPARE - Hydrologist (Medical): No    Lack of Transportation (Non-Medical): No  Physical Activity: Not on file  Stress: Not on file  Social Connections: Not on file  Intimate  Partner Violence: Not on file    Family History  Problem Relation Age of Onset   Arthritis Mother    Diabetes Mother    Alcohol abuse Father     Allergies  Allergen Reactions   Acetaminophen Nausea And Vomiting   Amoxicillin Swelling    sweats   Lisinopril Swelling    Lips and throat    Review of Systems  Constitutional: Negative.   HENT: Negative.    Eyes: Negative.   Respiratory: Negative.    Cardiovascular: Negative.   Gastrointestinal: Negative.   Genitourinary: Negative.   Skin: Negative.   Neurological: Negative.   Endo/Heme/Allergies: Negative.        Objective:   BP 100/70   Pulse 88   Ht 6' 3.5" (1.918 m)   Wt 218 lb 9.6 oz (99.2 kg)   SpO2 97%   BMI 26.96 kg/m   Vitals:   07/19/22 0845  BP: 100/70  Pulse: 88  Height: 6' 3.5" (1.918 m)  Weight: 218 lb 9.6 oz (99.2 kg)  SpO2: 97%  BMI (Calculated): 26.95    Physical Exam Vitals reviewed.  Constitutional:      Appearance: Normal appearance.  HENT:     Head: Normocephalic.     Left Ear: There is no impacted cerumen.     Nose: Nose normal.     Mouth/Throat:     Mouth: Mucous membranes are moist.     Pharynx: No posterior oropharyngeal erythema.  Eyes:     Extraocular Movements: Extraocular movements intact.     Pupils: Pupils are equal, round, and reactive to light.  Cardiovascular:     Rate and Rhythm: Regular rhythm.     Chest Wall: PMI is not displaced.     Pulses: Normal pulses.     Heart sounds: Normal heart sounds. No murmur heard. Pulmonary:     Effort: Pulmonary effort is normal.     Breath sounds: Normal air entry. No rhonchi or rales.  Abdominal:     General: Abdomen is flat. Bowel sounds are normal. There is no distension.     Palpations: Abdomen is soft. There is no hepatomegaly, splenomegaly or mass.     Tenderness: There is no abdominal tenderness.  Musculoskeletal:        General: Normal range of motion.     Cervical back: Normal range of motion and neck supple.      Right lower leg: No edema.     Left lower leg: No edema.  Skin:    General: Skin is warm and dry.  Neurological:     General: No focal deficit present.     Mental Status: He is alert and oriented to person, place, and time.     Cranial Nerves: No cranial nerve deficit.     Motor: No weakness.  Psychiatric:        Mood and Affect: Mood normal.        Behavior: Behavior normal.      Results for orders placed or performed in visit on 07/19/22  POCT Glucose (CBG)  Result Value Ref Range   POC Glucose 280 (A) 70 - 99 mg/dl    Recent Results (from the past 2160 hour(Damaria Vachon))  CBG monitoring, ED     Status: Abnormal   Collection Time: 04/23/22 11:44 PM  Result Value Ref Range   Glucose-Capillary >600 (HH) 70 - 99 mg/dL    Comment: Glucose reference range applies only to samples taken after fasting for at least 8 hours.  CBC with Differential     Status: None   Collection Time: 04/23/22 11:47 PM  Result Value Ref Range   WBC 6.6 4.0 - 10.5 K/uL   RBC 5.45 4.22 - 5.81 MIL/uL   Hemoglobin 16.0 13.0 - 17.0 g/dL   HCT 46.0 39.0 - 52.0 %   MCV 84.4 80.0 - 100.0 fL   MCH 29.4 26.0 - 34.0 pg   MCHC 34.8 30.0 - 36.0 g/dL   RDW 12.0 11.5 - 15.5 %   Platelets 200 150 - 400 K/uL   nRBC 0.0 0.0 - 0.2 %   Neutrophils Relative % 68 %   Neutro Abs 4.5 1.7 - 7.7 K/uL   Lymphocytes Relative 25 %   Lymphs Abs 1.6 0.7 - 4.0 K/uL   Monocytes Relative 5 %   Monocytes Absolute 0.3 0.1 - 1.0 K/uL   Eosinophils Relative 1 %   Eosinophils Absolute 0.1 0.0 - 0.5 K/uL   Basophils Relative 1 %   Basophils Absolute 0.1 0.0 - 0.1 K/uL   Immature Granulocytes 0 %   Abs Immature Granulocytes 0.01 0.00 - 0.07 K/uL    Comment: Performed at [facility],  [place] [address]  Comprehensive metabolic panel     Status: Abnormal   Collection Time: 04/23/22 11:47 PM  Result Value Ref Range   Sodium 132 (L) 135 - 145 mmol/L   Potassium 4.9 3.5 - 5.1 mmol/L   Chloride 99 98 -  111 mmol/L   CO2 22 22 - 32 mmol/L   Glucose, Bld 728 (HH) 70 - 99 mg/dL    Comment: CRITICAL RESULT CALLED TO, READ BACK BY AND VERIFIED WITH ASHLEY ORSUTO 04/24/2022 AT 0014 SRR Glucose reference range applies only to samples taken after fasting for at least 8 hours.    BUN 24 (H) 8 - 23 mg/dL   Creatinine, Ser 1.44 (H) 0.61 - 1.24 mg/dL   Calcium 9.0 8.9 - 10.3 mg/dL   Total Protein 7.0 6.5 - 8.1 g/dL   Albumin 3.5 3.5 - 5.0 g/dL   AST 20 15 - 41 U/L   ALT 26 0 - 44 U/L   Alkaline Phosphatase 194 (H) 38 - 126 U/L   Total Bilirubin 1.0 0.3 - 1.2 mg/dL   GFR, Estimated 54 (L) >60 mL/min    Comment: (NOTE) Calculated using the CKD-EPI Creatinine Equation (2021)    Anion gap 11 5 - 15    Comment: Performed at [facility], [address] [place], [place] [address]  Blood gas, venous     Status: Abnormal   Collection Time: 04/24/22 12:37 AM  Result Value Ref Range   pH, Ven 7.39 7.25 - 7.43   pCO2, Ven 37 (L) 44 - 60 mmHg   pO2, Ven 56 (H) 32 - 45 mmHg   Bicarbonate 22.4 20.0 - 28.0 mmol/L   Acid-base deficit 2.2 (H) 0.0 - 2.0 mmol/L   O2 Saturation 90.1 %   Patient temperature 37.0    Collection site VEIN     Comment: Performed at [facility], [address] [place], [place] [address]  POC CBG, ED     Status: Abnormal   Collection Time: 04/24/22 12:52 AM  Result Value Ref Range   Glucose-Capillary 582 (HH) 70 - 99 mg/dL    Comment: Glucose reference range applies only to samples taken after fasting for at least 8 hours.   Comment 1 Call MD NNP PA CNM   POC CBG, ED     Status: Abnormal   Collection Time: 04/24/22  2:05 AM  Result Value Ref Range   Glucose-Capillary 463 (H) 70 - 99 mg/dL    Comment: Glucose reference range applies only to samples taken after fasting for at least 8 hours.  CBG monitoring, ED     Status: Abnormal   Collection Time: 04/24/22  3:32 AM  Result Value Ref Range   Glucose-Capillary 390 (H) 70 - 99 mg/dL    Comment: Glucose  reference range applies only to samples taken after fasting for at least 8 hours.  Basic metabolic panel     Status: Abnormal   Collection Time: 04/24/22  3:56 AM  Result Value Ref Range   Sodium 138 135 - 145 mmol/L   Potassium 4.1 3.5 - 5.1 mmol/L   Chloride 109 98 - 111 mmol/L   CO2 22 22 - 32 mmol/L   Glucose, Bld 385 (H) 70 - 99 mg/dL    Comment: Glucose reference range applies only to samples taken after fasting for at least 8 hours.   BUN 19 8 - 23 mg/dL   Creatinine, Ser 1.05 0.61 - 1.24 mg/dL  Calcium 8.3 (L) 8.9 - 10.3 mg/dL   GFR, Estimated >60 >60 mL/min    Comment: (NOTE) Calculated using the CKD-EPI Creatinine Equation (2021)    Anion gap 7 5 - 15    Comment: Performed at [facility], [address] [place], [place] [address]  POC CBG, ED     Status: Abnormal   Collection Time: 04/24/22  4:33 AM  Result Value Ref Range   Glucose-Capillary 367 (H) 70 - 99 mg/dL    Comment: Glucose reference range applies only to samples taken after fasting for at least 8 hours.  Comprehensive metabolic panel     Status: Abnormal   Collection Time: 07/14/22  8:43 AM  Result Value Ref Range   Glucose 214 (H) 70 - 99 mg/dL   BUN 14 8 - 27 mg/dL   Creatinine, Ser 1.09 0.76 - 1.27 mg/dL   eGFR 75 >59 mL/min/1.73   BUN/Creatinine Ratio 13 10 - 24   Sodium 139 134 - 144 mmol/L   Potassium 4.2 3.5 - 5.2 mmol/L   Chloride 103 96 - 106 mmol/L   CO2 19 (L) 20 - 29 mmol/L   Calcium 9.5 8.6 - 10.2 mg/dL   Total Protein 6.1 6.0 - 8.5 g/dL   Albumin 4.0 3.9 - 4.9 g/dL   Globulin, Total 2.1 1.5 - 4.5 g/dL   Albumin/Globulin Ratio 1.9 1.2 - 2.2   Bilirubin Total 0.4 0.0 - 1.2 mg/dL   Alkaline Phosphatase 121 44 - 121 IU/L   AST 15 0 - 40 IU/L   ALT 13 0 - 44 IU/L  Lipid Panel w/o Chol/HDL Ratio     Status: Abnormal   Collection Time: 07/14/22  8:43 AM  Result Value Ref Range   Cholesterol, Total 118 100 - 199 mg/dL   Triglycerides 91 0 - 149 mg/dL   HDL 35 (L) >39 mg/dL    VLDL Cholesterol Cal 18 5 - 40 mg/dL   LDL Chol Calc (NIH) 65 0 - 99 mg/dL  Hgb A1c w/o eAG     Status: Abnormal   Collection Time: 07/14/22  8:43 AM  Result Value Ref Range   Hgb A1c MFr Bld 10.5 (H) 4.8 - 5.6 %    Comment:          Prediabetes: 5.7 - 6.4          Diabetes: >6.4          Glycemic control for adults with diabetes: <7.0   CK     Status: None   Collection Time: 07/14/22  8:43 AM  Result Value Ref Range   Total CK 77 41 - 331 U/L  POCT Glucose (CBG)     Status: Abnormal   Collection Time: 07/19/22  9:01 AM  Result Value Ref Range   POC Glucose 280 (A) 70 - 99 mg/dl      Assessment & Plan:   Problem List Items Addressed This Visit   None Visit Diagnoses     Type 2 diabetes mellitus without complication, unspecified whether long term insulin use (HCC)    -  Primary   Relevant Orders   POCT Glucose (CBG) (Completed)     1. Type 2 diabetes mellitus without complication, with long-term current use of insulin (HCC) - POCT Glucose (CBG) - Hemoglobin A1c - POCT UA - Microalbumin - POC CREATINE & ALBUMIN,URINE  2. Mixed hyperlipidemia - Lipid panel - CK, total  3. Primary hypertension - Comprehensive metabolic panel  4. Benign prostatic  hyperplasia with urinary frequency  5. Vitamin D deficiency    No follow-ups on file.   Total time spent: 30 minutes  Volanda Napoleon, MD  07/19/2022

## 2022-10-17 ENCOUNTER — Ambulatory Visit: Payer: Medicare HMO | Admitting: Internal Medicine

## 2022-11-07 ENCOUNTER — Ambulatory Visit: Payer: Medicare HMO | Admitting: Internal Medicine

## 2022-12-01 ENCOUNTER — Other Ambulatory Visit: Payer: Medicare HMO

## 2022-12-01 DIAGNOSIS — I10 Essential (primary) hypertension: Secondary | ICD-10-CM | POA: Diagnosis not present

## 2022-12-01 DIAGNOSIS — E119 Type 2 diabetes mellitus without complications: Secondary | ICD-10-CM | POA: Diagnosis not present

## 2022-12-01 DIAGNOSIS — Z794 Long term (current) use of insulin: Secondary | ICD-10-CM | POA: Diagnosis not present

## 2022-12-01 DIAGNOSIS — E782 Mixed hyperlipidemia: Secondary | ICD-10-CM | POA: Diagnosis not present

## 2022-12-02 LAB — COMPREHENSIVE METABOLIC PANEL
ALT: 10 IU/L (ref 0–44)
AST: 13 IU/L (ref 0–40)
Albumin: 3.8 g/dL — ABNORMAL LOW (ref 3.9–4.9)
Alkaline Phosphatase: 106 IU/L (ref 44–121)
BUN/Creatinine Ratio: 11 (ref 10–24)
BUN: 12 mg/dL (ref 8–27)
Bilirubin Total: 0.5 mg/dL (ref 0.0–1.2)
CO2: 23 mmol/L (ref 20–29)
Calcium: 9.1 mg/dL (ref 8.6–10.2)
Chloride: 103 mmol/L (ref 96–106)
Creatinine, Ser: 1.11 mg/dL (ref 0.76–1.27)
Globulin, Total: 2.1 g/dL (ref 1.5–4.5)
Glucose: 92 mg/dL (ref 70–99)
Potassium: 4.2 mmol/L (ref 3.5–5.2)
Sodium: 140 mmol/L (ref 134–144)
Total Protein: 5.9 g/dL — ABNORMAL LOW (ref 6.0–8.5)
eGFR: 73 mL/min/{1.73_m2} (ref >59)

## 2022-12-02 LAB — HEMOGLOBIN A1C
Est. average glucose Bld gHb Est-mCnc: 148 mg/dL
Hgb A1c MFr Bld: 6.8 % — ABNORMAL HIGH (ref 4.8–5.6)

## 2022-12-02 LAB — CK: Total CK: 114 U/L (ref 41–331)

## 2022-12-02 LAB — LIPID PANEL
Chol/HDL Ratio: 3.3 ratio (ref 0.0–5.0)
Cholesterol, Total: 137 mg/dL (ref 100–199)
HDL: 42 mg/dL (ref >39)
LDL Chol Calc (NIH): 83 mg/dL (ref 0–99)
Triglycerides: 57 mg/dL (ref 0–149)
VLDL Cholesterol Cal: 12 mg/dL (ref 5–40)

## 2022-12-11 ENCOUNTER — Ambulatory Visit: Payer: Medicare HMO | Admitting: Internal Medicine

## 2022-12-11 ENCOUNTER — Ambulatory Visit (INDEPENDENT_AMBULATORY_CARE_PROVIDER_SITE_OTHER): Payer: Medicare HMO | Admitting: Internal Medicine

## 2022-12-11 ENCOUNTER — Encounter: Payer: Self-pay | Admitting: Internal Medicine

## 2022-12-11 VITALS — BP 120/72 | HR 77 | Ht 75.0 in | Wt 232.6 lb

## 2022-12-11 DIAGNOSIS — E119 Type 2 diabetes mellitus without complications: Secondary | ICD-10-CM | POA: Diagnosis not present

## 2022-12-11 DIAGNOSIS — E782 Mixed hyperlipidemia: Secondary | ICD-10-CM

## 2022-12-11 DIAGNOSIS — Z794 Long term (current) use of insulin: Secondary | ICD-10-CM | POA: Diagnosis not present

## 2022-12-11 DIAGNOSIS — I10 Essential (primary) hypertension: Secondary | ICD-10-CM

## 2022-12-11 DIAGNOSIS — N401 Enlarged prostate with lower urinary tract symptoms: Secondary | ICD-10-CM

## 2022-12-11 DIAGNOSIS — F17210 Nicotine dependence, cigarettes, uncomplicated: Secondary | ICD-10-CM

## 2022-12-11 DIAGNOSIS — R350 Frequency of micturition: Secondary | ICD-10-CM

## 2022-12-11 DIAGNOSIS — E559 Vitamin D deficiency, unspecified: Secondary | ICD-10-CM | POA: Diagnosis not present

## 2022-12-11 LAB — POCT CBG (FASTING - GLUCOSE)-MANUAL ENTRY: Glucose Fasting, POC: 266 mg/dL — AB (ref 70–99)

## 2022-12-11 NOTE — Progress Notes (Signed)
Established Patient Office Visit  Subjective:  Patient ID: Gabriel Russell, male    DOB: 08-23-1955  Age: 67 y.o. MRN: [id number]  Chief Complaint  Patient presents with   Follow-up    3 Months Lab Results    No new complaints, here for lab review and medication refills. Labs reviewed and notable for well controlled diabetes, A1c at target, lipids at target with unremarkable cmp. Denies any hypoglycemic episodes and home bg readings have mostly been at target. Stopped taking Metformin due to diarrhea and weight loss.    No other concerns at this time.   Past Medical History:  Diagnosis Date   Arthritis    Concussion    GERD (gastroesophageal reflux disease)    OCC   Hyperlipidemia    Hypertension    Sleep apnea    cpap     >3 yrs   Subdural hematoma (HCC) 2014   AFTER A FALL ON ICE    Past Surgical History:  Procedure Laterality Date   ANTERIOR CERVICAL DECOMPRESSION/DISCECTOMY FUSION 4 LEVELS N/A 09/04/2013   Procedure: ANTERIOR CERVICAL DECOMPRESSION/DISCECTOMY FUSION CERVICAL THREE-FOUR, CERVICAL FOUR-FIVE, CERVICAL FIVE-SIX, CERVICALSIX-SEVEN ;  Surgeon: Reinaldo Meeker, MD;  Location: [facility];  Service: Neurosurgery;  Laterality: N/A;   BRAIN SURGERY     SUBDURAL HEMATOMA   JOINT REPLACEMENT Left    KNEE   SHOULDER ARTHROSCOPY WITH DEBRIDEMENT AND BICEP TENDON REPAIR Left 05/11/2016   Procedure: SHOULDER ARTHROSCOPY WITH DEBRIDEMENT AND BICEP TENDON REPAIR;  Surgeon: Christena Flake, MD;  Location: [facility];  Service: Orthopedics;  Laterality: Left;   SHOULDER ARTHROSCOPY WITH OPEN ROTATOR CUFF REPAIR Left 05/11/2016   Procedure: SHOULDER ARTHROSCOPY WITH OPEN ROTATOR CUFF REPAIR;  Surgeon: Christena Flake, MD;  Location: [facility];  Service: Orthopedics;  Laterality: Left;   SHOULDER ARTHROSCOPY WITH SUBACROMIAL DECOMPRESSION Left 05/11/2016   Procedure: SHOULDER ARTHROSCOPY WITH SUBACROMIAL DECOMPRESSION;  Surgeon: Christena Flake, MD;  Location: [facility];  Service:  Orthopedics;  Laterality: Left;    Social History   Socioeconomic History   Marital status: Divorced    Spouse name: Not on file   Number of children: Not on file   Years of education: Not on file   Highest education level: Not on file  Occupational History   Occupation: disability  Tobacco Use   Smoking status: Light Smoker    Years: 15    Types: Cigarettes   Smokeless tobacco: Never   Tobacco comments:    2 cigarettes per day  Vaping Use   Vaping Use: Never used  Substance and Sexual Activity   Alcohol use: No   Drug use: No    Comment: pt denies but UDS in 2014 + for marijuana   Sexual activity: Not on file  Other Topics Concern   Not on file  Social History Narrative   Not on file   Social Determinants of Health   Financial Resource Strain: Not on file  Food Insecurity: No Food Insecurity (01/17/2022)   Hunger Vital Sign    Worried About Running Out of Food in the Last Year: Never true    Ran Out of Food in the Last Year: Never true  Transportation Needs: No Transportation Needs (01/17/2022)   PRAPARE - Administrator, Civil Service (Medical): No    Lack of Transportation (Non-Medical): No  Physical Activity: Not on file  Stress: Not on file  Social Connections: Not on file  Intimate Partner Violence: Not on file  Family History  Problem Relation Age of Onset   Arthritis Mother    Diabetes Mother    Alcohol abuse Father     Allergies  Allergen Reactions   Acetaminophen Nausea And Vomiting   Amoxicillin Swelling    sweats   Lisinopril Swelling    Lips and throat    Review of Systems  Constitutional: Negative.  Negative for weight loss.  HENT: Negative.    Eyes: Negative.   Respiratory: Negative.    Cardiovascular: Negative.   Gastrointestinal: Negative.   Genitourinary: Negative.   Skin: Negative.   Neurological: Negative.   Endo/Heme/Allergies: Negative.        Objective:   BP 120/72   Pulse 77   Ht 6\' 3"  (1.905 m)    Wt 232 lb 9.6 oz (105.5 kg)   SpO2 99%   BMI 29.07 kg/m   Vitals:   12/11/22 1318  BP: 120/72  Pulse: 77  Height: 6\' 3"  (1.905 m)  Weight: 232 lb 9.6 oz (105.5 kg)  SpO2: 99%  BMI (Calculated): 29.07    Physical Exam Vitals reviewed.  Constitutional:      Appearance: Normal appearance.  HENT:     Head: Normocephalic.     Left Ear: There is no impacted cerumen.     Nose: Nose normal.     Mouth/Throat:     Mouth: Mucous membranes are moist.     Pharynx: No posterior oropharyngeal erythema.  Eyes:     Extraocular Movements: Extraocular movements intact.     Pupils: Pupils are equal, round, and reactive to light.  Cardiovascular:     Rate and Rhythm: Regular rhythm.     Chest Wall: PMI is not displaced.     Pulses: Normal pulses.     Heart sounds: Normal heart sounds. No murmur heard. Pulmonary:     Effort: Pulmonary effort is normal.     Breath sounds: Normal air entry. No rhonchi or rales.  Abdominal:     General: Abdomen is flat. Bowel sounds are normal. There is no distension.     Palpations: Abdomen is soft. There is no hepatomegaly, splenomegaly or mass.     Tenderness: There is no abdominal tenderness.  Musculoskeletal:        General: Normal range of motion.     Cervical back: Normal range of motion and neck supple.     Right lower leg: No edema.     Left lower leg: No edema.  Skin:    General: Skin is warm and dry.  Neurological:     General: No focal deficit present.     Mental Status: He is alert and oriented to person, place, and time.     Cranial Nerves: No cranial nerve deficit.     Motor: No weakness.  Psychiatric:        Mood and Affect: Mood normal.        Behavior: Behavior normal.      Results for orders placed or performed in visit on 12/11/22  POCT CBG (Fasting - Glucose)  Result Value Ref Range   Glucose Fasting, POC 266 (A) 70 - 99 mg/dL    Recent Results (from the past 2160 hour(Oralia Criger))  Lipid panel     Status: None   Collection  Time: 12/01/22  8:33 AM  Result Value Ref Range   Cholesterol, Total 137 100 - 199 mg/dL   Triglycerides 57 0 - 149 mg/dL   HDL 42 >88 mg/dL   VLDL Cholesterol Cal  12 5 - 40 mg/dL   LDL Chol Calc (NIH) 83 0 - 99 mg/dL   Chol/HDL Ratio 3.3 0.0 - 5.0 ratio    Comment:                                   T. Chol/HDL Ratio                                             Men  Women                               1/2 Avg.Risk  3.4    3.3                                   Avg.Risk  5.0    4.4                                2X Avg.Risk  9.6    7.1                                3X Avg.Risk 23.4   11.0   CK, total     Status: None   Collection Time: 12/01/22  8:33 AM  Result Value Ref Range   Total CK 114 41 - 331 U/L  Hemoglobin A1c     Status: Abnormal   Collection Time: 12/01/22  8:33 AM  Result Value Ref Range   Hgb A1c MFr Bld 6.8 (H) 4.8 - 5.6 %    Comment:          Prediabetes: 5.7 - 6.4          Diabetes: >6.4          Glycemic control for adults with diabetes: <7.0    Est. average glucose Bld gHb Est-mCnc 148 mg/dL  Comprehensive metabolic panel     Status: Abnormal   Collection Time: 12/01/22  8:33 AM  Result Value Ref Range   Glucose 92 70 - 99 mg/dL   BUN 12 8 - 27 mg/dL   Creatinine, Ser 9.62 0.76 - 1.27 mg/dL   eGFR 73 >95 MW/UXL/2.44   BUN/Creatinine Ratio 11 10 - 24   Sodium 140 134 - 144 mmol/L   Potassium 4.2 3.5 - 5.2 mmol/L   Chloride 103 96 - 106 mmol/L   CO2 23 20 - 29 mmol/L   Calcium 9.1 8.6 - 10.2 mg/dL   Total Protein 5.9 (L) 6.0 - 8.5 g/dL   Albumin 3.8 (L) 3.9 - 4.9 g/dL   Globulin, Total 2.1 1.5 - 4.5 g/dL   Bilirubin Total 0.5 0.0 - 1.2 mg/dL   Alkaline Phosphatase 106 44 - 121 IU/L   AST 13 0 - 40 IU/L   ALT 10 0 - 44 IU/L  POCT CBG (Fasting - Glucose)     Status: Abnormal   Collection Time: 12/11/22  1:20 PM  Result Value Ref Range   Glucose Fasting, POC 266 (A) 70 - 99 mg/dL      Assessment & Plan:   As  per problem list.  Problem List Items  Addressed This Visit       Cardiovascular and Mediastinum   HTN (hypertension)   Relevant Orders   CBC With Diff/Platelet     Genitourinary   BPH (benign prostatic hyperplasia)   Relevant Orders   PSA     Other   Vitamin D deficiency   HLD (hyperlipidemia)   Relevant Orders   Lipid panel   Other Visit Diagnoses     Type 2 diabetes mellitus without complication, with long-term current use of insulin (HCC)    -  Primary   Relevant Medications   insulin glargine (LANTUS) 100 UNIT/ML injection   Other Relevant Orders   POCT CBG (Fasting - Glucose) (Completed)   Hemoglobin A1c   Lipid panel       Return in about 3 months (around 03/13/2023) for awv with labs prior.   Total time spent: 20 minutes  Luna Fuse, MD  12/11/2022   This document may have been prepared by [facility] Voice Recognition software and as such may include unintentional dictation errors.

## 2023-03-20 ENCOUNTER — Ambulatory Visit: Payer: Medicare HMO | Admitting: Internal Medicine

## 2023-04-10 ENCOUNTER — Ambulatory Visit: Payer: Medicare HMO | Admitting: Internal Medicine

## 2023-05-15 ENCOUNTER — Ambulatory Visit: Payer: Medicare HMO | Admitting: Internal Medicine

## 2024-01-05 ENCOUNTER — Other Ambulatory Visit: Payer: Self-pay

## 2024-01-05 ENCOUNTER — Emergency Department
Admission: EM | Admit: 2024-01-05 | Discharge: 2024-01-05 | Disposition: A | Discharge status: Home / Self Care | Attending: Emergency Medicine | Admitting: Emergency Medicine

## 2024-01-05 DIAGNOSIS — E119 Type 2 diabetes mellitus without complications: Secondary | ICD-10-CM | POA: Diagnosis not present

## 2024-01-05 DIAGNOSIS — S01112A Laceration without foreign body of left eyelid and periocular area, initial encounter: Secondary | ICD-10-CM | POA: Diagnosis not present

## 2024-01-05 DIAGNOSIS — I10 Essential (primary) hypertension: Secondary | ICD-10-CM | POA: Diagnosis not present

## 2024-01-05 DIAGNOSIS — S0181XA Laceration without foreign body of other part of head, initial encounter: Secondary | ICD-10-CM

## 2024-01-05 DIAGNOSIS — S4991XA Unspecified injury of right shoulder and upper arm, initial encounter: Secondary | ICD-10-CM | POA: Insufficient documentation

## 2024-01-05 DIAGNOSIS — Z23 Encounter for immunization: Secondary | ICD-10-CM | POA: Insufficient documentation

## 2024-01-05 DIAGNOSIS — W19XXXA Unspecified fall, initial encounter: Secondary | ICD-10-CM | POA: Diagnosis not present

## 2024-01-05 DIAGNOSIS — S0993XA Unspecified injury of face, initial encounter: Secondary | ICD-10-CM | POA: Diagnosis present

## 2024-01-05 HISTORY — DX: Type 2 diabetes mellitus without complications: E11.9

## 2024-01-05 MED ORDER — DOXYCYCLINE HYCLATE 100 MG PO TABS: 100.0000 mg | ORAL_TABLET | Freq: Two times a day (BID) | ORAL | 0 refills | Status: AC

## 2024-01-05 MED ORDER — TETANUS-DIPHTH-ACELL PERTUSSIS 5-2.5-18.5 LF-MCG/0.5 IM SUSY
0.5000 mL | PREFILLED_SYRINGE | Freq: Once | INTRAMUSCULAR | Status: AC
  Administered 2024-01-05: 0.5 mL via INTRAMUSCULAR
  Filled 2024-01-05: qty 0.5

## 2024-01-05 NOTE — ED Notes (Signed)
 Pt aox4, nad noted. Pt given tv remote per request. Blanket offered, pt states he needs to be seen.

## 2024-01-05 NOTE — ED Provider Notes (Signed)
 [facility] Provider Note    Event Date/Time   First MD Initiated Contact with Patient 01/05/24 1041     (approximate)   History   Laceration   HPI  Gabriel Russell is a 68 y.o. male history of hypertension, subdural hematoma, diabetes hyperlipidemia presents emergency department after a fall yesterday.  States hit his forehead when he fell.  No LOC, no concussion symptoms, no headache today.  States he basically needs antibiotic for the open wound on his eyebrow.  He does not know when his last Tdap was.  Also has right shoulder pain.  Not in the bone but with movement when he is trying to raise it up as he reached out to  catch himself as he felt      Physical Exam   Triage Vital Signs: ED Triage Vitals  Encounter Vitals Group     BP 01/05/24 1032 133/74     Girls Systolic BP Percentile --      Girls Diastolic BP Percentile --      Boys Systolic BP Percentile --      Boys Diastolic BP Percentile --      Pulse Rate 01/05/24 1032 88     Resp 01/05/24 1032 16     Temp 01/05/24 1032 98 F (36.7 C)     Temp Source 01/05/24 1032 Oral     SpO2 01/05/24 1032 100 %     Weight 01/05/24 1031 230 lb (104.3 kg)     Height 01/05/24 1031 6' 3 (1.905 m)     Head Circumference --      Peak Flow --      Pain Score 01/05/24 1029 10     Pain Loc --      Pain Education --      Exclude from Growth Chart --     Most recent vital signs: Vitals:   01/05/24 1032  BP: 133/74  Pulse: 88  Resp: 16  Temp: 98 F (36.7 C)  SpO2: 100%     General: Awake, no distress.   CV:  Good peripheral perfusion.  Resp:  Normal effort.  Abd:  No distention.   Other:  Laceration over left eyebrow, areas are already healing, 2 areas that are slightly open, no drainage, skull nontender, C-spine nontender, cranial nerves II through XII grossly intact, right shoulder is tender with in the musculature, no bony tenderness appreciated, pain reproduced with movement of overhead  reach   ED Results / Procedures / Treatments   Labs (all labs ordered are listed, but only abnormal results are displayed) Labs Reviewed - No data to display   EKG     RADIOLOGY     PROCEDURES:   .Laceration Repair  Date/Time: 01/05/2024 11:47 AM  Performed by: Gasper Devere ORN, PA-C Authorized by: Gasper Devere ORN, PA-C   Consent:    Consent obtained:  Verbal   Consent given by:  Patient   Risks, benefits, and alternatives were discussed: yes     Risks discussed:  Pain, retained foreign body, tendon damage, poor cosmetic result, need for additional repair, infection, nerve damage, poor wound healing and vascular damage   Alternatives discussed:  No treatment Universal protocol:    Procedure explained and questions answered to patient or proxy's satisfaction: yes     Immediately prior to procedure, a time out was called: yes     Patient identity confirmed:  Verbally with patient Anesthesia:    Anesthesia method:  None Laceration details:  Location:  Face   Face location:  L eyebrow   Length (cm):  2 Exploration:    Limited defect created (wound extended): no     Hemostasis achieved with:  Direct pressure   Imaging outcome: foreign body not noted     Wound exploration: wound explored through full range of motion     Wound extent: areolar tissue not violated, fascia not violated, no foreign body, no signs of injury, no nerve damage, no tendon damage, no underlying fracture and no vascular damage     Contaminated: no   Treatment:    Area cleansed with:  Saline   Amount of cleaning:  Standard   Irrigation solution:  Sterile saline   Irrigation method:  Tap   Debridement:  None   Undermining:  None   Scar revision: no   Skin repair:    Repair method:  Steri-Strips   Number of Steri-Strips:  2 Approximation:    Approximation:  Loose Repair type:    Repair type:  Simple Post-procedure details:    Dressing:  Open (no dressing)   Procedure completion:   Tolerated well, no immediate complications Comments:     Performed by Edsel PA student under my supervision   Critical Care: No Chief Complaint  Patient presents with   Laceration      MEDICATIONS ORDERED IN ED: Medications  Tdap (BOOSTRIX) injection 0.5 mL (has no administration in time range)     IMPRESSION / MDM / ASSESSMENT AND PLAN / ED COURSE  I reviewed the triage vital signs and the nursing notes.                              Differential diagnosis includes, but is not limited to, fall, facial laceration, contusion, subdural, SAH, fracture, shoulder strain  Patient's presentation is most consistent with acute illness / injury with system symptoms.   Steri-Strips applied to the wound Tdap updated  Subdural SAH less likely as patient does not have a headache.  He is not on blood thinner.  So will not do advanced imaging per the patient's request.  Shoulder does not appear to have a fracture, as pain is located with movement of the musculature.  Patient also does not want a right shoulder x-ray so we will place patient in a sling and have him follow-up with orthopedics.  Patient is in agreement treatment plan.  He is discharged stable condition with a prescription for doxycycline  as he is allergic to the penicillins and gets swelling.      FINAL CLINICAL IMPRESSION(S) / ED DIAGNOSES   Final diagnoses:  Facial laceration, initial encounter  Injury of right shoulder, initial encounter     Rx / DC Orders   ED Discharge Orders          Ordered    doxycycline  (VIBRA -TABS) 100 MG tablet  2 times daily        01/05/24 1142             Note:  This document was prepared using Dragon voice recognition software and may include unintentional dictation errors.    Gasper Devere ORN, PA-C 01/05/24 1148    Nicholaus Rolland BRAVO, MD 01/05/24 [phone]

## 2024-01-05 NOTE — ED Triage Notes (Signed)
 Pt to ED for L eyebrow laceration from mechanical fall yesterday when his knee gave out on him. States is overdue for his cortisone shot. Lac has bandaid over it. States knee is very painful.
